# Patient Record
Sex: Male | Born: 1960
Health system: Southern US, Community
[De-identification: ages and names within clinical notes are randomized; demographics above are authoritative.]

## PROBLEM LIST (undated history)

## (undated) DIAGNOSIS — J449 Chronic obstructive pulmonary disease, unspecified: Secondary | ICD-10-CM

## (undated) DIAGNOSIS — I4891 Unspecified atrial fibrillation: Secondary | ICD-10-CM

## (undated) HISTORY — PX: LEG SURGERY: SHX1003

---

## 2016-06-05 ENCOUNTER — Emergency Department (HOSPITAL_BASED_OUTPATIENT_CLINIC_OR_DEPARTMENT_OTHER)
Admission: EM | Admit: 2016-06-05 | Discharge: 2016-06-05 | Disposition: A | Discharge status: Home / Self Care | Payer: Medicaid Other | Attending: Emergency Medicine | Admitting: Emergency Medicine

## 2016-06-05 ENCOUNTER — Emergency Department (HOSPITAL_BASED_OUTPATIENT_CLINIC_OR_DEPARTMENT_OTHER): Payer: Medicaid Other

## 2016-06-05 ENCOUNTER — Encounter (HOSPITAL_BASED_OUTPATIENT_CLINIC_OR_DEPARTMENT_OTHER): Payer: Self-pay | Admitting: *Deleted

## 2016-06-05 DIAGNOSIS — I4891 Unspecified atrial fibrillation: Secondary | ICD-10-CM | POA: Diagnosis not present

## 2016-06-05 DIAGNOSIS — F17210 Nicotine dependence, cigarettes, uncomplicated: Secondary | ICD-10-CM | POA: Diagnosis not present

## 2016-06-05 DIAGNOSIS — R0602 Shortness of breath: Secondary | ICD-10-CM | POA: Diagnosis present

## 2016-06-05 LAB — BASIC METABOLIC PANEL
ANION GAP: 9 (ref 5–15)
BUN: 12 mg/dL (ref 6–20)
CALCIUM: 9 mg/dL (ref 8.9–10.3)
CO2: 25 mmol/L (ref 22–32)
CREATININE: 0.96 mg/dL (ref 0.61–1.24)
Chloride: 102 mmol/L (ref 101–111)
GFR calc Af Amer: >60 mL/min (ref >60)
GLUCOSE: 165 mg/dL — AB (ref 65–99)
Potassium: 4 mmol/L (ref 3.5–5.1)
Sodium: 136 mmol/L (ref 135–145)

## 2016-06-05 LAB — CBC WITH DIFFERENTIAL/PLATELET
Basophils Absolute: 0.1 10*3/uL (ref 0.0–0.1)
Basophils Relative: 1 %
EOS PCT: 3 %
Eosinophils Absolute: 0.3 10*3/uL (ref 0.0–0.7)
HEMATOCRIT: 42.6 % (ref 39.0–52.0)
Hemoglobin: 13.7 g/dL (ref 13.0–17.0)
LYMPHS ABS: 1.9 10*3/uL (ref 0.7–4.0)
LYMPHS PCT: 22 %
MCH: 30.2 pg (ref 26.0–34.0)
MCHC: 32.2 g/dL (ref 30.0–36.0)
MCV: 94 fL (ref 78.0–100.0)
MONO ABS: 0.9 10*3/uL (ref 0.1–1.0)
MONOS PCT: 10 %
NEUTROS ABS: 5.7 10*3/uL (ref 1.7–7.7)
Neutrophils Relative %: 64 %
PLATELETS: 242 10*3/uL (ref 150–400)
RBC: 4.53 MIL/uL (ref 4.22–5.81)
RDW: 14.1 % (ref 11.5–15.5)
WBC: 8.9 10*3/uL (ref 4.0–10.5)

## 2016-06-05 LAB — PROTIME-INR
INR: 1.21
Prothrombin Time: 15.4 seconds — ABNORMAL HIGH (ref 11.4–15.2)

## 2016-06-05 MED ORDER — DILTIAZEM HCL 60 MG PO TABS: 60.0000 mg | ORAL_TABLET | Freq: Four times a day (QID) | ORAL | 0 refills | Status: AC

## 2016-06-05 MED ORDER — FUROSEMIDE 10 MG/ML IJ SOLN
40.0000 mg | Freq: Once | INTRAMUSCULAR | Status: AC
  Administered 2016-06-05: 40 mg via INTRAVENOUS
  Filled 2016-06-05: qty 4

## 2016-06-05 MED ORDER — DILTIAZEM HCL 100 MG IV SOLR
5.0000 mg/h | Freq: Once | INTRAVENOUS | Status: AC
  Administered 2016-06-05: 5 mg/h via INTRAVENOUS

## 2016-06-05 MED ORDER — DILTIAZEM HCL-DEXTROSE 100-5 MG/100ML-% IV SOLN (PREMIX)
INTRAVENOUS | Status: AC
  Administered 2016-06-05: 5 mg via INTRAVENOUS
  Filled 2016-06-05: qty 100

## 2016-06-05 MED ORDER — DILTIAZEM HCL 25 MG/5ML IV SOLN
20.0000 mg | Freq: Once | INTRAVENOUS | Status: AC
  Administered 2016-06-05: 20 mg via INTRAVENOUS
  Filled 2016-06-05: qty 5

## 2016-06-05 MED ORDER — DILTIAZEM HCL ER COATED BEADS 120 MG PO CP24
120.0000 mg | ORAL_CAPSULE | Freq: Once | ORAL | Status: AC
Start: 2016-06-05 — End: 2016-06-05
  Administered 2016-06-05: 120 mg via ORAL
  Filled 2016-06-05: qty 1

## 2016-06-05 MED ORDER — FUROSEMIDE 20 MG PO TABS: 20.0000 mg | ORAL_TABLET | Freq: Every day | ORAL | 0 refills | Status: AC

## 2016-06-05 NOTE — Discharge Instructions (Signed)
Fill your prescriptions tonight to start tomorrow morning.  Take a baby aspirin each morning.  Office appointment tomorrow afternoon at 4:00 at Dr. Roseanne Kaufman office. Please be on time.

## 2016-06-05 NOTE — ED Triage Notes (Signed)
C/o sob x 3 weeks worse with walking. No pain. No other sx.

## 2016-06-05 NOTE — ED Provider Notes (Addendum)
[facility]   CSN: [id number] Arrival date & time: 06/05/16  1551  First Provider Contact:  None    By signing my name below, I, Tanner Hawkins, attest that this documentation has been prepared under the direction and in the presence of Rolland Porter, MD . Electronically Signed: Majel Hawkins, Scribe. 06/05/2016. 4:36 PM.  History   Chief Complaint Chief Complaint  Patient presents with  . Shortness of Breath   HPI Comments: Tanner Hawkins is a 55 y.o. male who presents to the Emergency Department complaining of gradually worsening, intermittent, shortness of breath that began 3 weeks ago. Pt reports his pain is exacerbated when breathing deeply, walking, and lying flat on his back at night. He notes he was told ~1 year ago that he should have his heart checked due to an irregular rhythm; he states he did not receive an EKG. Pt denies dizziness, lightheadedness, chest pain or pressure, leg swelling, and use of any illicit drugs. He states he smokes ~1 pack a day. He denies hx of HTN, HLD and DM.   The history is provided by the patient. No language interpreter was used.   History reviewed. No pertinent past medical history.  There are no active problems to display for this patient.  History reviewed. No pertinent surgical history.   Home Medications    Prior to Admission medications   Medication Sig Start Date End Date Taking? Authorizing Provider  diltiazem (CARDIZEM) 60 MG tablet Take 1 tablet (60 mg total) by mouth 4 (four) times daily. 06/05/16   Rolland Porter, MD  furosemide (LASIX) 20 MG tablet Take 1 tablet (20 mg total) by mouth daily. 06/05/16   Rolland Porter, MD    Family History No family history on file.  Social History Social History  Substance Use Topics  . Smoking status: Current Every Day Smoker    Packs/day: 1.00    Types: Cigarettes  . Smokeless tobacco: Never Used  . Alcohol use Not on file     Allergies   Review of patient's allergies  indicates no known allergies.  Review of Systems Review of Systems  Constitutional: Negative for appetite change, chills, diaphoresis, fatigue and fever.  HENT: Negative for mouth sores, sore throat and trouble swallowing.   Eyes: Negative for visual disturbance.  Respiratory: Positive for shortness of breath. Negative for cough, chest tightness and wheezing.   Cardiovascular: Negative for chest pain and leg swelling.  Gastrointestinal: Negative for abdominal distention, abdominal pain, diarrhea, nausea and vomiting.  Endocrine: Negative for polydipsia, polyphagia and polyuria.  Genitourinary: Negative for dysuria, frequency and hematuria.  Musculoskeletal: Negative for gait problem.  Skin: Negative for color change, pallor and rash.  Neurological: Negative for dizziness, syncope, light-headedness and headaches.  Hematological: Does not bruise/bleed easily.  Psychiatric/Behavioral: Negative for behavioral problems and confusion.   Physical Exam Updated Vital Signs BP (!) 145/102 (BP Location: Left Arm)   Pulse 68   Temp 98 F (36.7 C) (Oral)   Resp 18   Ht 6' (1.829 m)   Wt 214 lb (97.1 kg)   SpO2 99%   BMI 29.02 kg/m   Physical Exam  Constitutional: He is oriented to person, place, and time. He appears well-developed and well-nourished. No distress.  HENT:  Head: Normocephalic.  Eyes: Conjunctivae are normal. Pupils are equal, round, and reactive to light. No scleral icterus.  Neck: Normal range of motion. Neck supple. No thyromegaly present.  Cardiovascular: Normal heart sounds.  Exam reveals no gallop  and no friction rub.   No murmur heard. Tachycardic and irregular   Pulmonary/Chest: Effort normal and breath sounds normal. No respiratory distress. He has no wheezes. He has no rales.  Abdominal: Soft. Bowel sounds are normal. He exhibits no distension. There is no tenderness. There is no rebound.  Musculoskeletal: Normal range of motion.  Neurological: He is alert and  oriented to person, place, and time.  Skin: Skin is warm and dry. No rash noted.  Psychiatric: He has a normal mood and affect. His behavior is normal.   ED Treatments / Results  Labs (all labs ordered are listed, but only abnormal results are displayed) Labs Reviewed  BASIC METABOLIC PANEL - Abnormal; Notable for the following:       Result Value   Glucose, Bld 165 (*)    All other components within normal limits  PROTIME-INR - Abnormal; Notable for the following:    Prothrombin Time 15.4 (*)    All other components within normal limits  CBC WITH DIFFERENTIAL/PLATELET  TROPONIN I   EKG  EKG Interpretation None       Radiology Dg Chest 2 View  Result Date: 06/05/2016 CLINICAL DATA:  Shortness breath for 2-3 weeks. EXAM: CHEST  2 VIEW COMPARISON:  None. FINDINGS: The heart size is borderline. The hila and mediastinum are normal. Mild increased interstitial markings centrally are identified. No pulmonary nodules or masses. No focal infiltrates. IMPRESSION: Mild increased central interstitial markings, right greater than left, suggesting mild asymmetric edema. Atypical infection could have a similar appearance in the appropriate clinical setting. No other acute abnormalities. Electronically Signed   By: Gerome Sam III M.D   On: 06/05/2016 17:26   Procedures Procedures  DIAGNOSTIC STUDIES:  Oxygen Saturation is 99% on RA, normal by my interpretation.    COORDINATION OF CARE:  4:35 PM Discussed treatment plan, which includes EKG with pt at bedside and pt agreed to plan.  Medications Ordered in ED Medications  diltiazem (CARDIZEM) injection 20 mg (20 mg Intravenous Given 06/05/16 1723)  diltiazem (CARDIZEM) 100 mg in dextrose 5 % 100 mL (1 mg/mL) infusion (0 mg/hr Intravenous Stopped 06/05/16 1755)  dilTIAZem HCl-Dextrose 100-5 MG/100ML-% infusion SOLN (5 mg Intravenous Given 06/05/16 1731)  diltiazem (CARDIZEM CD) 24 hr capsule 120 mg (120 mg Oral Given 06/05/16 1833)    furosemide (LASIX) injection 40 mg (40 mg Intravenous Given 06/05/16 1901)    Initial Impression / Assessment and Plan / ED Course  I have reviewed the triage vital signs and the nursing notes.  Pertinent labs & imaging results that were available during my care of the patient were reviewed by me and considered in my medical decision making (see chart for details).  Clinical Course    I discussed the case with on-call cardiology Dr.Kadakia.  He recommended heparin, admission, TEE and early cardioversion. He felt that this sooner if this could happen the bed of the patient's possibility of conversion considering he has been symptomatic for 3 weeks.  I discussed this at length with the patient. He very politely but adamantly states he cannot will not stay in the hospital as he has "things to do". He states he asked speak with his boss and that he absolutely has to be at work in the morning and feels that he does this to his employer.  I did have him sign an AGAINST MEDICAL ADVICE form. I feel that the most prudent course of action for him would be to follow the advice of the  consulting cardiologist. However, he has been stable over the 3 weeks. He does have some mild dyspnea but no overt CHF. Dr. Algie Coffer has recommended Cardizem. Patient's chadsvasc2 score is 0-1 based on presence or absence of congestive heart failure ( suggested on chest x-ray, but not clinically).  I recommended daily aspirin, Cardizem 60 mg every 6 hours, daily Lasix 20 mg daily.  Doctor Algie Coffer felt he can see the patient tomorrow in his office at 4:00 in the afternoon and the patient can simply show up  Final Clinical Impressions(s) / ED Diagnoses   Final diagnoses:  Atrial fibrillation, unspecified type (HCC)    CRITICAL CARE Performed by: Rolland Porter JOSEPH   Total critical care time: 30 minutes including bolus of IV Cardizem and initiation of drip. After rate control drip was able to be almost immediately  discontinued.  Critical care time was exclusive of separately billable procedures and treating other patients.  Critical care was necessary to treat or prevent imminent or life-threatening deterioration.  Critical care was time spent personally by me on the following activities: development of treatment plan with patient and/or surrogate as well as nursing, discussions with consultants, evaluation of patient's response to treatment, examination of patient, obtaining history from patient or surrogate, ordering and performing treatments and interventions, ordering and review of laboratory studies, ordering and review of radiographic studies, pulse oximetry and re-evaluation of patient's condition. care  New Prescriptions New Prescriptions   DILTIAZEM (CARDIZEM) 60 MG TABLET    Take 1 tablet (60 mg total) by mouth 4 (four) times daily.   FUROSEMIDE (LASIX) 20 MG TABLET    Take 1 tablet (20 mg total) by mouth daily.   I personally performed the services described in this documentation, which was scribed in my presence. The recorded information has been reviewed and is accurate.     Rolland Porter, MD 06/05/16 2002  I personally performed the services described in this documentation, which was scribed in my presence. The recorded information has been reviewed and is accurate.    Rolland Porter, MD 06/05/16 2003

## 2016-06-05 NOTE — ED Notes (Signed)
Pt transported to xray 

## 2016-06-05 NOTE — ED Notes (Signed)
Due to heart rate MD informed to hold drip at this time right after starting.

## 2018-06-29 ENCOUNTER — Emergency Department (HOSPITAL_BASED_OUTPATIENT_CLINIC_OR_DEPARTMENT_OTHER)
Admission: EM | Admit: 2018-06-29 | Discharge: 2018-06-29 | Disposition: A | Discharge status: Home / Self Care | Payer: Medicaid Other | Attending: Emergency Medicine | Admitting: Emergency Medicine

## 2018-06-29 ENCOUNTER — Encounter (HOSPITAL_BASED_OUTPATIENT_CLINIC_OR_DEPARTMENT_OTHER): Payer: Self-pay

## 2018-06-29 ENCOUNTER — Other Ambulatory Visit: Payer: Self-pay

## 2018-06-29 DIAGNOSIS — F17210 Nicotine dependence, cigarettes, uncomplicated: Secondary | ICD-10-CM | POA: Insufficient documentation

## 2018-06-29 DIAGNOSIS — I4891 Unspecified atrial fibrillation: Secondary | ICD-10-CM | POA: Diagnosis not present

## 2018-06-29 DIAGNOSIS — Z79899 Other long term (current) drug therapy: Secondary | ICD-10-CM | POA: Diagnosis not present

## 2018-06-29 DIAGNOSIS — L03116 Cellulitis of left lower limb: Secondary | ICD-10-CM | POA: Diagnosis not present

## 2018-06-29 DIAGNOSIS — Z7901 Long term (current) use of anticoagulants: Secondary | ICD-10-CM | POA: Diagnosis not present

## 2018-06-29 HISTORY — DX: Unspecified atrial fibrillation: I48.91

## 2018-06-29 MED ORDER — DOXYCYCLINE HYCLATE 100 MG PO TABS
100.0000 mg | ORAL_TABLET | Freq: Once | ORAL | Status: AC
  Administered 2018-06-29: 100 mg via ORAL
  Filled 2018-06-29: qty 1

## 2018-06-29 MED ORDER — CEPHALEXIN 250 MG PO CAPS
500.0000 mg | ORAL_CAPSULE | Freq: Once | ORAL | Status: AC
  Administered 2018-06-29: 500 mg via ORAL
  Filled 2018-06-29: qty 2

## 2018-06-29 MED ORDER — DOXYCYCLINE HYCLATE 100 MG PO CAPS: 100.0000 mg | ORAL_CAPSULE | Freq: Two times a day (BID) | ORAL | 0 refills | Status: DC

## 2018-06-29 MED ORDER — CEPHALEXIN 500 MG PO CAPS: 500.0000 mg | ORAL_CAPSULE | Freq: Three times a day (TID) | ORAL | 0 refills | Status: AC

## 2018-06-29 NOTE — ED Notes (Signed)
[facility] directed on [facility]/c instructions

## 2018-06-29 NOTE — Discharge Instructions (Addendum)
Please take both antibiotics until they are completely gone.  Soak the left leg in warm water daily.  Return to the ER immediately if you have any new or concerning symptoms like fever, worsening redness and swelling, pain with moving your left knee.

## 2018-06-29 NOTE — ED Provider Notes (Signed)
[facility] EMERGENCY DEPARTMENT Provider Note   CSN: [id number] Arrival date & time: 06/29/18  2056     History   Chief Complaint No chief complaint on file.   HPI Tanner Hawkins is a 57 y.o. male.  HPI   Tanner Hawkins is a 57 year old male with a history of atrial fibrillation who presents to the emergency department for evaluation of left knee erythema and swelling.  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  He thinks he may have hit his left knee on a bolt a couple days ago.  States that he noticed an area under the left kneecap which is red and has become more swollen over the past few days.  He was able to express some yellow purulence from the site yesterday.  He denies fevers, chills, numbness, weakness, erythema or wounds elsewhere.  Denies pain with knee ROM.  He reports pain is mild at this time, worsened with palpation.  Has not taken any over-the-counter medications for symptoms.  Past Medical History:  Diagnosis Date  . Atrial fibrillation (HCC)     There are no active problems to display for this patient.   History reviewed. No pertinent surgical history.      Home Medications    Prior to Admission medications   Medication Sig Start Date End Date Taking? Authorizing Provider  digoxin (LANOXIN) 0.25 MG tablet Take 0.25 mg by mouth daily.   Yes [provider]  diltiazem (CARDIZEM) 60 MG tablet Take 1 tablet (60 mg total) by mouth 4 (four) times daily. 06/05/16  Yes Rolland Porter, MD  potassium chloride (K-DUR,KLOR-CON) 10 MEQ tablet Take 10 mEq by mouth 2 (two) times daily.   Yes [provider]  rivaroxaban (XARELTO) 10 MG TABS tablet Take 10 mg by mouth daily.   Yes [provider]  furosemide (LASIX) 20 MG tablet Take 1 tablet (20 mg total) by mouth daily. 06/05/16   Rolland Porter, MD    Family History No family history on file.  Social History Social History    Tobacco Use  . Smoking status: Current Every Day Smoker    Packs/day: 1.00    Types: Cigarettes  . Smokeless tobacco: Never Used  Substance Use Topics  . Alcohol use: Never    Frequency: Never  . Drug use: Never     Allergies   Patient has no known allergies.   Review of Systems Review of Systems  Constitutional: Negative for chills and fever.  Musculoskeletal: Positive for arthralgias (left lower leg).  Skin: Positive for color change (left lower leg).  Neurological: Negative for weakness and numbness.  Psychiatric/Behavioral: Negative for agitation.     Physical Exam Updated Vital Signs BP (!) 167/92 (BP Location: Left Arm)   Pulse 98   Temp 98.8 F (37.1 C) (Oral)   Resp (!) 28   Ht 6' (1.829 m)   Wt 96.6 kg   SpO2 100%   BMI 28.89 kg/m   Physical Exam  Constitutional: He appears well-developed and well-nourished. No distress.  Afebrile and nontoxic-appearing.  HENT:  Head: Normocephalic and atraumatic.  Eyes: Right eye exhibits no discharge. Left eye exhibits no discharge.  Pulmonary/Chest: Effort normal. No respiratory distress.  Musculoskeletal:  Left tibial tuberosity with a 5cm area of erythema, swelling and overlying tenderness. No palpable area of fluctuance.  Erythema does not extend to the knee joint itself.  Full active left knee ROM without pain.  DP pulses 2+ and symmetric bilaterally.  Sensation to light touch intact in bilateral lower extremities.  Neurological: He is alert. Coordination normal.  Skin: He is not diaphoretic.  Psychiatric: He has a normal mood and affect. His behavior is normal.  Nursing note and vitals reviewed.      ED Treatments / Results  Labs (all labs ordered are listed, but only abnormal results are displayed) Labs Reviewed - No data to display  EKG None  Radiology No results found.  Procedures Procedures (including critical care time) EMERGENCY DEPARTMENT US SOFT TISSUE INTERPRETATION "Study: Limited  Soft Tissue Ultrasound"  INDICATIONS: Soft tissue infection Multiple views of the body part were obtained in real-time with a multi-frequency linear probe  PERFORMED BY: Myself IMAGES ARCHIVED?: Yes SIDE:Left BODY PART:Lower extremity INTERPRETATION:  No abcess noted and Cellulitis present  Medications Ordered in ED Medications  doxycycline (VIBRA-TABS) tablet 100 mg (has no administration in time range)  cephALEXin (KEFLEX) capsule 500 mg (has no administration in time range)     Initial Impression / Assessment and Plan / ED Course  I have reviewed the triage vital signs and the nursing notes.  Pertinent labs & imaging results that were available during my care of the patient were reviewed by me and considered in my medical decision making (see chart for details).    Exam consistent with developing cellulitis.  No erythema over the knee joint itself or painful knee ROM.  I have no concern for septic arthritis given exam findings.  Bedside ultrasound with cellulitis, no abscess.  Plan to discharge with dual antibiotic therapy.  Have counseled him on home warm soaks.  Later today, counseled him to have this rechecked by his PCP.  We discussed strict return precautions and he agrees and appears reliable.  Final Clinical Impressions(s) / ED Diagnoses   Final diagnoses:  Cellulitis of left lower extremity    ED Discharge Orders         Ordered    doxycycline (VIBRAMYCIN) 100 MG capsule  2 times daily     06/29/18 2329    cephALEXin (KEFLEX) 500 MG capsule  3 times daily     06/29/18 2329           Kellie ShropshireShrosbree, Wynne Rozak J, PA-C 06/29/18 2330    Gwyneth SproutPlunkett, Whitney, MD 07/01/18 [phone]

## 2018-06-29 NOTE — ED Triage Notes (Signed)
Pt presents with redness to left knee- no obvious swelling. Ambulated without difficulty to triage room. Pt works on [employer] bareing weight on his knees often.

## 2018-09-15 ENCOUNTER — Emergency Department (HOSPITAL_BASED_OUTPATIENT_CLINIC_OR_DEPARTMENT_OTHER)
Admission: EM | Admit: 2018-09-15 | Discharge: 2018-09-15 | Disposition: A | Discharge status: Home / Self Care | Payer: Medicaid Other | Attending: Emergency Medicine | Admitting: Emergency Medicine

## 2018-09-15 ENCOUNTER — Other Ambulatory Visit: Payer: Self-pay

## 2018-09-15 ENCOUNTER — Encounter (HOSPITAL_BASED_OUTPATIENT_CLINIC_OR_DEPARTMENT_OTHER): Payer: Self-pay | Admitting: Emergency Medicine

## 2018-09-15 DIAGNOSIS — Z7901 Long term (current) use of anticoagulants: Secondary | ICD-10-CM | POA: Insufficient documentation

## 2018-09-15 DIAGNOSIS — F17210 Nicotine dependence, cigarettes, uncomplicated: Secondary | ICD-10-CM | POA: Diagnosis not present

## 2018-09-15 DIAGNOSIS — L03115 Cellulitis of right lower limb: Secondary | ICD-10-CM | POA: Diagnosis not present

## 2018-09-15 DIAGNOSIS — M79661 Pain in right lower leg: Secondary | ICD-10-CM | POA: Diagnosis present

## 2018-09-15 DIAGNOSIS — Z79899 Other long term (current) drug therapy: Secondary | ICD-10-CM | POA: Insufficient documentation

## 2018-09-15 MED ORDER — DOXYCYCLINE HYCLATE 100 MG PO CAPS: 100.0000 mg | ORAL_CAPSULE | Freq: Two times a day (BID) | ORAL | 0 refills | Status: DC

## 2018-09-15 NOTE — ED Triage Notes (Signed)
Reports "infection on my right leg".  Report this is red without drainage x 2-3 days.

## 2018-09-15 NOTE — ED Provider Notes (Signed)
[facility] EMERGENCY DEPARTMENT Provider Note   CSN: [id number] Arrival date & time: 09/15/18  1434     History   Chief Complaint Chief Complaint  Patient presents with  . Leg Pain    HPI Tanner Hawkins is a 57 y.o. male.  The history is provided by the patient.  Leg Pain   The pain is present in the right knee. The pain is at a severity of 0/10. The patient is experiencing no pain. Associated symptoms comments: 2 days ago noted some swelling and pustules to the right upper knee.  Squeezed a few and had a small amount of pus and blood.  Now the redness has worsened over the last 2 days.  No pain.  Able to bend the knee without difficulty.  No fever and o/w feeling well.  Similar sx of same.  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 The treatment provided no relief. There has been a history of trauma (scraped up from work).    Past Medical History:  Diagnosis Date  . Atrial fibrillation (HCC)     There are no active problems to display for this patient.   History reviewed. No pertinent surgical history.      Home Medications    Prior to Admission medications   Medication Sig Start Date End Date Taking? Authorizing Provider  digoxin (LANOXIN) 0.25 MG tablet Take 0.25 mg by mouth daily.    [provider]  diltiazem (CARDIZEM) 60 MG tablet Take 1 tablet (60 mg total) by mouth 4 (four) times daily. 06/05/16   Rolland Porter, MD  doxycycline (VIBRAMYCIN) 100 MG capsule Take 1 capsule (100 mg total) by mouth 2 (two) times daily. 09/15/18   Gwyneth Sprout, MD  furosemide (LASIX) 20 MG tablet Take 1 tablet (20 mg total) by mouth daily. 06/05/16   Rolland Porter, MD  potassium chloride (K-DUR,KLOR-CON) 10 MEQ tablet Take 10 mEq by mouth 2 (two) times daily.    [provider]  rivaroxaban (XARELTO) 10 MG TABS tablet Take 10 mg by mouth daily.     [provider]    Family History History reviewed. No pertinent family history.  Social History Social History   Tobacco Use  . Smoking status: Current Every Day Smoker    Packs/day: 1.00    Types: Cigarettes  . Smokeless tobacco: Never Used  Substance Use Topics  . Alcohol use: Never    Frequency: Never  . Drug use: Never     Allergies   Patient has no known allergies.   Review of Systems Review of Systems  All other systems reviewed and are negative.    Physical Exam Updated Vital Signs BP (!) 142/97 (BP Location: Left Arm)   Pulse 90   Temp 98 F (36.7 C) (Oral)   Resp 18   Ht 5\' 11"  (1.803 m)   Wt 96.6 kg   SpO2 100%   BMI 29.71 kg/m   Physical Exam  Constitutional: He is oriented to person, place, and time. He appears well-developed and well-nourished. No distress.  HENT:  Head: Normocephalic and atraumatic.  Mouth/Throat: Oropharynx is clear and moist.  Eyes: Pupils are equal, round, and reactive to light. Conjunctivae and EOM are normal.  Neck: Normal range of motion. Neck supple.  Cardiovascular: Normal rate, regular rhythm and intact distal pulses.  No murmur heard. Pulmonary/Chest: Effort normal and breath  sounds normal. No respiratory distress. He has no wheezes. He has no rales.  Abdominal: Soft. He exhibits no distension. There is no tenderness. There is no rebound and no guarding.  Musculoskeletal: He exhibits no edema or tenderness.       Right knee: He exhibits swelling and erythema. He exhibits normal range of motion and no bony tenderness. No tenderness found.       Legs: Neurological: He is alert and oriented to person, place, and time.  Skin: Skin is warm and dry. No rash noted. No erythema.  Psychiatric: He has a normal mood and affect. His behavior is normal.  Nursing note and vitals reviewed.    ED Treatments / Results  Labs (all labs ordered are listed, but only abnormal results are displayed) Labs Reviewed - No  data to display  EKG None  Radiology No results found.  Procedures Procedures (including critical care time)  Medications Ordered in ED Medications - No data to display   Initial Impression / Assessment and Plan / ED Course  I have reviewed the triage vital signs and the nursing notes.  Pertinent labs & imaging results that were available during my care of the patient were reviewed by me and considered in my medical decision making (see chart for details).     57 year old male presenting today with evidence of cellulitis to the right knee after draining some pustules after scraping his leg.  He has full range of motion his knee and no evidence of septic joint.  This all seems superficial and cellulitis from recent small abscesses.  Patient has no systemic symptoms.  Vital signs are reassuring.  Will cover with doxycycline and have him follow-up with his PCP if not improving.  Final Clinical Impressions(s) / ED Diagnoses   Final diagnoses:  Cellulitis of right lower extremity    ED Discharge Orders         Ordered    doxycycline (VIBRAMYCIN) 100 MG capsule  2 times daily     09/15/18 1524           Gwyneth Sprout, MD 09/15/18 1529

## 2018-10-17 ENCOUNTER — Other Ambulatory Visit: Payer: Self-pay

## 2018-10-17 ENCOUNTER — Emergency Department (HOSPITAL_BASED_OUTPATIENT_CLINIC_OR_DEPARTMENT_OTHER)
Admission: EM | Admit: 2018-10-17 | Discharge: 2018-10-17 | Disposition: A | Discharge status: Home / Self Care | Payer: Medicaid Other | Attending: Emergency Medicine | Admitting: Emergency Medicine

## 2018-10-17 ENCOUNTER — Encounter (HOSPITAL_BASED_OUTPATIENT_CLINIC_OR_DEPARTMENT_OTHER): Payer: Self-pay | Admitting: *Deleted

## 2018-10-17 DIAGNOSIS — B9789 Other viral agents as the cause of diseases classified elsewhere: Secondary | ICD-10-CM | POA: Diagnosis not present

## 2018-10-17 DIAGNOSIS — Z79899 Other long term (current) drug therapy: Secondary | ICD-10-CM | POA: Insufficient documentation

## 2018-10-17 DIAGNOSIS — R05 Cough: Secondary | ICD-10-CM | POA: Diagnosis present

## 2018-10-17 DIAGNOSIS — F17210 Nicotine dependence, cigarettes, uncomplicated: Secondary | ICD-10-CM | POA: Insufficient documentation

## 2018-10-17 DIAGNOSIS — J449 Chronic obstructive pulmonary disease, unspecified: Secondary | ICD-10-CM | POA: Diagnosis not present

## 2018-10-17 DIAGNOSIS — J069 Acute upper respiratory infection, unspecified: Secondary | ICD-10-CM | POA: Insufficient documentation

## 2018-10-17 DIAGNOSIS — Z7901 Long term (current) use of anticoagulants: Secondary | ICD-10-CM | POA: Insufficient documentation

## 2018-10-17 NOTE — ED Provider Notes (Signed)
[facility] EMERGENCY DEPARTMENT Provider Note   CSN: [id number] Arrival date & time: 10/17/18  1842     History   Chief Complaint Chief Complaint  Patient presents with  . Cough    HPI Tanner Hawkins is a 57 y.o. male with PMH of afib on xarelto and COPD. presenting with 2 days of runny nose, congestion, and cough. His roommate has been sick for the past 2 weeks. Mr. Reckner is worried he'll get more sick and wanted to come in to get checked out. He reports his cough is productive with clear mucous. He is not producing more mucous than normal. He has no shortness of breath. He denies wheezing. He is a smoker and uses spiriva daily and albuterol 2 puffs once a day. He has not needed to use his inhaler more often. He is eating and drinking normally with normal urine and stools.   HPI  Past Medical History:  Diagnosis Date  . Atrial fibrillation (HCC)     There are no active problems to display for this patient.   History reviewed. No pertinent surgical history.     Home Medications    Prior to Admission medications   Medication Sig Start Date End Date Taking? Authorizing Provider  digoxin (LANOXIN) 0.25 MG tablet Take 0.25 mg by mouth daily.    [provider]  diltiazem (CARDIZEM) 60 MG tablet Take 1 tablet (60 mg total) by mouth 4 (four) times daily. 06/05/16   Rolland Porter, MD  doxycycline (VIBRAMYCIN) 100 MG capsule Take 1 capsule (100 mg total) by mouth 2 (two) times daily. 09/15/18   Gwyneth Sprout, MD  furosemide (LASIX) 20 MG tablet Take 1 tablet (20 mg total) by mouth daily. 06/05/16   Rolland Porter, MD  potassium chloride (K-DUR,KLOR-CON) 10 MEQ tablet Take 10 mEq by mouth 2 (two) times daily.    [provider]  rivaroxaban (XARELTO) 10 MG TABS tablet Take 10 mg by mouth daily.    [provider]    Family History No family history on file.  Social History Social History   Tobacco Use  . Smoking status: Current Every Day  Smoker    Packs/day: 1.00    Types: Cigarettes  . Smokeless tobacco: Never Used  Substance Use Topics  . Alcohol use: Never    Frequency: Never  . Drug use: Never     Allergies   Patient has no known allergies.   Review of Systems Review of Systems  Constitutional: Negative for activity change, appetite change, chills and fever.  HENT: Positive for congestion and rhinorrhea. Negative for ear pain, facial swelling and sore throat.   Eyes: Negative for redness.  Respiratory: Positive for cough. Negative for shortness of breath and wheezing.   Cardiovascular: Negative for chest pain and palpitations.  Gastrointestinal: Negative for abdominal pain, constipation, diarrhea, nausea and vomiting.  Genitourinary: Negative for decreased urine volume and difficulty urinating.  Musculoskeletal: Negative for myalgias.  Skin: Negative for rash.  Neurological: Negative for weakness and headaches.     Physical Exam Updated Vital Signs BP (!) 135/93 (BP Location: Left Arm)   Pulse 94   Temp 98.2 F (36.8 C) (Oral)   Resp 20   Ht 5\' 11"  (1.803 m)   Wt 102.1 kg   SpO2 100%   BMI 31.38 kg/m   Physical Exam Vitals signs and nursing note reviewed.  Constitutional:      Appearance: Normal appearance.  HENT:     Head: Normocephalic and  atraumatic.     Nose: Congestion present.     Mouth/Throat:     Mouth: Mucous membranes are moist.     Pharynx: No oropharyngeal exudate or posterior oropharyngeal erythema.  Eyes:     Extraocular Movements: Extraocular movements intact.     Pupils: Pupils are equal, round, and reactive to light.  Neck:     Musculoskeletal: Normal range of motion and neck supple.  Cardiovascular:     Rate and Rhythm: Normal rate and regular rhythm.     Pulses: Normal pulses.     Heart sounds: Normal heart sounds.  Pulmonary:     Effort: Pulmonary effort is normal.     Breath sounds: Wheezing present.  Abdominal:     General: Abdomen is flat.     Palpations:  Abdomen is soft.     Tenderness: There is no abdominal tenderness.  Musculoskeletal: Normal range of motion.  Skin:    General: Skin is warm and dry.     Capillary Refill: Capillary refill takes less than 2 seconds.  Neurological:     General: No focal deficit present.     Mental Status: He is alert and oriented to person, place, and time.  Psychiatric:        Mood and Affect: Mood normal.        Behavior: Behavior normal.      ED Treatments / Results  Labs (all labs ordered are listed, but only abnormal results are displayed) Labs Reviewed - No data to display  EKG None  Radiology No results found.  Procedures Procedures (including critical care time)  Medications Ordered in ED Medications - No data to display   Initial Impression / Assessment and Plan / ED Course  I have reviewed the triage vital signs and the nursing notes.  Pertinent labs & imaging results that were available during my care of the patient were reviewed by me and considered in my medical decision making (see chart for details).     57 year old male with COPD and afib presenting with 2 days of viral URI symptoms. No increased sputum, fevers, or SOB to indicate COPD exacerbation. He is well appearing and afebrile. Minimal wheezing on exam. Reassurance provided. Advised him to continue spiriva and schedule albuterol every 6 hours for next 2-3 days given viral illness. Discussed reasons to return including fevers, increased sputum, SOB. Patient verbalized understanding and agreement with plan.   Final Clinical Impressions(s) / ED Diagnoses   Final diagnoses:  Viral URI with cough    ED Discharge Orders    None       Tillman SersRiccio, Wilberta Dorvil C, DO 10/17/18 2115    Little, Ambrose Finlandachel Morgan, MD 10/20/18 2037

## 2018-10-17 NOTE — Discharge Instructions (Signed)
°  Nice to see you today! You have a virus causing your symptoms.  Please use your albuterol inhaler every 6 hours for the next 2-3 days. You can take over the counter cough medicine like mucinex-D for your cough and congestion. If you have any difficulty breathing, are coughing up colored sputum, or have fevers, please return to be seen.

## 2018-10-17 NOTE — ED Notes (Signed)
ED Provider at bedside. 

## 2018-10-17 NOTE — ED Triage Notes (Signed)
Cough and runny nose x 3 days.

## 2018-10-25 ENCOUNTER — Emergency Department (HOSPITAL_BASED_OUTPATIENT_CLINIC_OR_DEPARTMENT_OTHER): Payer: Medicaid Other

## 2018-10-25 ENCOUNTER — Emergency Department (HOSPITAL_BASED_OUTPATIENT_CLINIC_OR_DEPARTMENT_OTHER)
Admission: EM | Admit: 2018-10-25 | Discharge: 2018-10-25 | Disposition: A | Discharge status: Home / Self Care | Payer: Medicaid Other | Attending: Emergency Medicine | Admitting: Emergency Medicine

## 2018-10-25 ENCOUNTER — Other Ambulatory Visit: Payer: Self-pay

## 2018-10-25 ENCOUNTER — Encounter (HOSPITAL_BASED_OUTPATIENT_CLINIC_OR_DEPARTMENT_OTHER): Payer: Self-pay | Admitting: Emergency Medicine

## 2018-10-25 DIAGNOSIS — Z7901 Long term (current) use of anticoagulants: Secondary | ICD-10-CM | POA: Diagnosis not present

## 2018-10-25 DIAGNOSIS — J441 Chronic obstructive pulmonary disease with (acute) exacerbation: Secondary | ICD-10-CM | POA: Insufficient documentation

## 2018-10-25 DIAGNOSIS — Z79899 Other long term (current) drug therapy: Secondary | ICD-10-CM | POA: Insufficient documentation

## 2018-10-25 DIAGNOSIS — R0602 Shortness of breath: Secondary | ICD-10-CM | POA: Diagnosis present

## 2018-10-25 DIAGNOSIS — F17210 Nicotine dependence, cigarettes, uncomplicated: Secondary | ICD-10-CM | POA: Insufficient documentation

## 2018-10-25 MED ORDER — ALBUTEROL SULFATE (2.5 MG/3ML) 0.083% IN NEBU: 2.5000 mg | INHALATION_SOLUTION | Freq: Four times a day (QID) | RESPIRATORY_TRACT | 12 refills | Status: DC | PRN

## 2018-10-25 MED ORDER — PREDNISONE 50 MG PO TABS
60.0000 mg | ORAL_TABLET | Freq: Once | ORAL | Status: AC
  Administered 2018-10-25: 60 mg via ORAL
  Filled 2018-10-25: qty 1

## 2018-10-25 MED ORDER — ALBUTEROL SULFATE (2.5 MG/3ML) 0.083% IN NEBU
5.0000 mg | INHALATION_SOLUTION | Freq: Once | RESPIRATORY_TRACT | Status: AC
  Administered 2018-10-25: 5 mg via RESPIRATORY_TRACT
  Filled 2018-10-25: qty 6

## 2018-10-25 NOTE — Progress Notes (Signed)
Patient ambulated around the department while on pulse ox.  Patient's SPO2 remained between 95% and 96% while ambulating and HR was 112 upon return to his room.

## 2018-10-25 NOTE — ED Notes (Signed)
Pt. Tanner Hawkins he was still having shortness of breath since [facility].  Pt. Felt he was no better and never had a breathing treatment like they said he would get.   Pt.  Has seen Resp. Therapy here tonight and received treatment with reports of breathing better now.

## 2018-10-25 NOTE — ED Triage Notes (Signed)
Reports shortness of breath with productive cough.

## 2018-10-25 NOTE — ED Provider Notes (Signed)
[facility] EMERGENCY DEPARTMENT Provider Note   CSN: [id number] Arrival date & time: 10/25/18  1800     History   Chief Complaint Chief Complaint  Patient presents with  . Shortness of Breath    HPI Tanner Hawkins is a 57 y.o. male.  Patient is a 57 year old male with a history of COPD who presents with shortness of breath.  He states for the bout the last week and a half he has had some runny nose nasal congestion and coughing.  He has been seen here as well as [facility] in the emergency department over the last 2 weeks.  He states he has been using his albuterol inhaler frequently during the day.  He denies any chest pain.  His cough is mostly nonproductive but he also has some nasal congestion which he says is yellow and green.  No known fevers.  He has not reported any known wheezing.  He gets short of breath mostly when he is at work.  He says he is unable to follow-up with his PCP because Dr. Algie Coffer is in [place] until January.     Past Medical History:  Diagnosis Date  . Atrial fibrillation (HCC)     There are no active problems to display for this patient.   History reviewed. No pertinent surgical history.      Home Medications    Prior to Admission medications   Medication Sig Start Date End Date Taking? Authorizing Provider  albuterol (PROVENTIL) (2.5 MG/3ML) 0.083% nebulizer solution Take 3 mLs (2.5 mg total) by nebulization every 6 (six) hours as needed for wheezing or shortness of breath. 10/25/18   Rolan Bucco, MD  digoxin (LANOXIN) 0.25 MG tablet Take 0.25 mg by mouth daily.    [provider]  diltiazem (CARDIZEM) 60 MG tablet Take 1 tablet (60 mg total) by mouth 4 (four) times daily. 06/05/16   Rolland Porter, MD  doxycycline (VIBRAMYCIN) 100 MG capsule Take 1 capsule (100 mg total) by mouth 2 (two) times daily. 09/15/18   Gwyneth Sprout, MD  furosemide (LASIX) 20 MG tablet Take 1 tablet (20 mg total) by  mouth daily. 06/05/16   Rolland Porter, MD  potassium chloride (K-DUR,KLOR-CON) 10 MEQ tablet Take 10 mEq by mouth 2 (two) times daily.    [provider]  rivaroxaban (XARELTO) 10 MG TABS tablet Take 10 mg by mouth daily.    [provider]    Family History History reviewed. No pertinent family history.  Social History Social History   Tobacco Use  . Smoking status: Current Every Day Smoker    Packs/day: 1.00    Types: Cigarettes  . Smokeless tobacco: Never Used  Substance Use Topics  . Alcohol use: Never    Frequency: Never  . Drug use: Never     Allergies   Patient has no known allergies.   Review of Systems Review of Systems  Constitutional: Negative for chills, diaphoresis, fatigue and fever.  HENT: Positive for congestion and postnasal drip. Negative for rhinorrhea and sneezing.   Eyes: Negative.   Respiratory: Positive for cough and shortness of breath. Negative for chest tightness.   Cardiovascular: Negative for chest pain and leg swelling.  Gastrointestinal: Negative for abdominal pain, blood in stool, diarrhea, nausea and vomiting.  Genitourinary: Negative for difficulty urinating, flank pain, frequency and hematuria.  Musculoskeletal: Negative for arthralgias and back pain.  Skin: Negative for rash.  Neurological: Negative for dizziness, speech difficulty, weakness, numbness and headaches.  Physical Exam Updated Vital Signs BP (!) 147/99   Pulse 88   SpO2 94%   Physical Exam Constitutional:      Appearance: He is well-developed.  HENT:     Head: Normocephalic and atraumatic.  Eyes:     Pupils: Pupils are equal, round, and reactive to light.  Neck:     Musculoskeletal: Normal range of motion and neck supple.  Cardiovascular:     Rate and Rhythm: Normal rate and regular rhythm.     Heart sounds: Normal heart sounds.  Pulmonary:     Effort: Pulmonary effort is normal. No respiratory distress.     Breath sounds: Decreased breath  sounds and wheezing present. No rales.  Chest:     Chest wall: No tenderness.  Abdominal:     General: Bowel sounds are normal.     Palpations: Abdomen is soft.     Tenderness: There is no abdominal tenderness. There is no guarding or rebound.  Musculoskeletal: Normal range of motion.     Right lower leg: He exhibits no tenderness. No edema.     Left lower leg: He exhibits no tenderness. No edema.  Lymphadenopathy:     Cervical: No cervical adenopathy.  Skin:    General: Skin is warm and dry.     Findings: No rash.  Neurological:     Mental Status: He is alert and oriented to person, place, and time.      ED Treatments / Results  Labs (all labs ordered are listed, but only abnormal results are displayed) Labs Reviewed - No data to display  EKG EKG Interpretation  Date/Time:  Friday October 25 2018 18:11:27 EST Ventricular Rate:  92 PR Interval:    QRS Duration: 92 QT Interval:  344 QTC Calculation: 425 R Axis:   84 Text Interpretation:  Atrial fibrillation Abnormal ECG since last tracing no significant change Confirmed by Rolan BuccoBelfi, Denver Harder [phone]([id number]) on 10/25/2018 6:13:41 PM   Radiology Dg Chest 2 View  Result Date: 10/25/2018 CLINICAL DATA:  Cough and shortness of breath for 1 and half weeks. EXAM: CHEST - 2 VIEW COMPARISON:  Two-view chest x-ray 10/23/2018 FINDINGS: The heart size and mediastinal contours are within normal limits. Both lungs are clear. The visualized skeletal structures are unremarkable. IMPRESSION: Negative two view chest x-ray Electronically Signed   By: Marin Robertshristopher  Mattern M.D.   On: 10/25/2018 18:47    Procedures Procedures (including critical care time)  Medications Ordered in ED Medications  albuterol (PROVENTIL) (2.5 MG/3ML) 0.083% nebulizer solution 5 mg (5 mg Nebulization Given 10/25/18 1947)  predniSONE (DELTASONE) tablet 60 mg (60 mg Oral Given 10/25/18 1945)     Initial Impression / Assessment and Plan / ED Course  I have reviewed  the triage vital signs and the nursing notes.  Pertinent labs & imaging results that were available during my care of the patient were reviewed by me and considered in my medical decision making (see chart for details).     Patient presents with wheezing and shortness of breath.  He has diminished breath sounds.  He was given nebulizer treatment and feels much better.  He is able to ambulate without hypoxia or shortness of breath.  He is currently on a prednisone burst that was prescribed to him from [facility].  His EKG does not show any ischemic changes.  Chest x-ray does not show any evidence of pneumonia.  He does not want any labs done because he had labs done at [facility] 2  days ago.  I reviewed these labs and he had negative troponin, his other labs were non-concerning.  His white count was minimally elevated.  Patient currently is back to baseline.  He has no hypoxia or shortness of breath or persistent tachycardia which we more concerning for PE.  He does not have symptoms that would be more concerning for ACS.  He is on Symbicort and he is currently on prednisone.  He does have a nebulizer machine at home but does not have the solution.  I will give him prescription for the nebulizer solution which may work better than his albuterol inhaler.  He will follow-up with Dr. Algie CofferKadakia soon as he gets back.  He was advised to return to the emergency room if he has any worsening symptoms.  Final Clinical Impressions(s) / ED Diagnoses   Final diagnoses:  COPD exacerbation [facility](HCC)    ED Discharge Orders         Ordered    albuterol (PROVENTIL) (2.5 MG/3ML) 0.083% nebulizer solution  Every 6 hours PRN     10/25/18 2143           Rolan BuccoBelfi, Kelisha Dall, MD 10/25/18 2147

## 2018-10-25 NOTE — ED Notes (Signed)
PT reports that [facility] and that they were all normal. HE would prefer to not have labs drawn unless necessary.

## 2019-01-13 ENCOUNTER — Encounter (HOSPITAL_BASED_OUTPATIENT_CLINIC_OR_DEPARTMENT_OTHER): Payer: Self-pay | Admitting: *Deleted

## 2019-01-13 ENCOUNTER — Emergency Department (HOSPITAL_BASED_OUTPATIENT_CLINIC_OR_DEPARTMENT_OTHER)
Admission: EM | Admit: 2019-01-13 | Discharge: 2019-01-14 | Disposition: A | Discharge status: Home / Self Care | Payer: Medicaid Other | Attending: Emergency Medicine | Admitting: Emergency Medicine

## 2019-01-13 ENCOUNTER — Other Ambulatory Visit: Payer: Self-pay

## 2019-01-13 DIAGNOSIS — Z79899 Other long term (current) drug therapy: Secondary | ICD-10-CM | POA: Insufficient documentation

## 2019-01-13 DIAGNOSIS — L02416 Cutaneous abscess of left lower limb: Secondary | ICD-10-CM | POA: Insufficient documentation

## 2019-01-13 DIAGNOSIS — R2242 Localized swelling, mass and lump, left lower limb: Secondary | ICD-10-CM | POA: Diagnosis present

## 2019-01-13 DIAGNOSIS — F17210 Nicotine dependence, cigarettes, uncomplicated: Secondary | ICD-10-CM | POA: Insufficient documentation

## 2019-01-13 DIAGNOSIS — L03116 Cellulitis of left lower limb: Secondary | ICD-10-CM

## 2019-01-13 NOTE — ED Provider Notes (Signed)
[facility] Provider Note: Lowella Dell, MD, FACEP  CSN: [id number] MRN: [id number] ARRIVAL: 01/13/19 at 2013 ROOM: MHFT2/MHFT2   CHIEF COMPLAINT  Abscess   HISTORY OF PRESENT ILLNESS  01/13/19 11:51 PM Tanner Hawkins is a 58 y.o. male who is had a tender, swollen, erythematous area on his left thigh for the past several days.  There is a central pustule.  Pain is moderate, worse with palpation.  He denies other complaint.   Past Medical History:  Diagnosis Date  . Atrial fibrillation (HCC)     History reviewed. No pertinent surgical history.  No family history on file.  Social History   Tobacco Use  . Smoking status: Current Every Day Smoker    Packs/day: 1.00    Types: Cigarettes  . Smokeless tobacco: Never Used  Substance Use Topics  . Alcohol use: Never    Frequency: Never  . Drug use: Never    Prior to Admission medications   Medication Sig Start Date End Date Taking? Authorizing Provider  albuterol (PROVENTIL) (2.5 MG/3ML) 0.083% nebulizer solution Take 3 mLs (2.5 mg total) by nebulization every 6 (six) hours as needed for wheezing or shortness of breath. 10/25/18  Yes Rolan Bucco, MD  digoxin (LANOXIN) 0.25 MG tablet Take 0.25 mg by mouth daily.   Yes [provider]  diltiazem (CARDIZEM) 60 MG tablet Take 1 tablet (60 mg total) by mouth 4 (four) times daily. 06/05/16  Yes Rolland Porter, MD  furosemide (LASIX) 20 MG tablet Take 1 tablet (20 mg total) by mouth daily. 06/05/16  Yes Rolland Porter, MD  potassium chloride (K-DUR,KLOR-CON) 10 MEQ tablet Take 10 mEq by mouth 2 (two) times daily.   Yes [provider]  rivaroxaban (XARELTO) 10 MG TABS tablet Take 10 mg by mouth daily.   Yes [provider]  doxycycline (VIBRAMYCIN) 100 MG capsule Take 1 capsule (100 mg total) by mouth 2 (two) times daily. 09/15/18   Gwyneth Sprout, MD    Allergies Patient has no known allergies.   REVIEW OF SYSTEMS  Negative except as noted  here or in the History of Present Illness.   PHYSICAL EXAMINATION  Initial Vital Signs Blood pressure 124/84, pulse 90, temperature 97.9 F (36.6 C), temperature source Oral, resp. rate 20, height 6' (1.829 m), weight 93.7 kg, SpO2 98 %.  Examination General: Well-developed, well-nourished male in no acute distress; appearance consistent with age of record HENT: normocephalic; atraumatic Eyes: Normal appearance Neck: supple Heart: regular rate and rhythm Lungs: clear to auscultation bilaterally Abdomen: soft; nondistended; nontender; bowel sounds present Extremities: No deformity; full range of motion Neurologic: Awake, alert and oriented; motor function intact in all extremities and symmetric; no facial droop Skin: Warm and dry; tender, erythematous lesion left anterior thigh with central pustule:    Psychiatric: Normal mood and affect   RESULTS  Summary of this visit's results, reviewed by myself:   EKG Interpretation  Date/Time:    Ventricular Rate:    PR Interval:    QRS Duration:   QT Interval:    QTC Calculation:   R Axis:     Text Interpretation:        Laboratory Studies: No results found for this or any previous visit (from the past 24 hour(s)). Imaging Studies: No results found.  ED COURSE and MDM  Nursing notes and initial vitals signs, including pulse oximetry, reviewed.  Vitals:   01/13/19 2018 01/13/19 2020  BP:  124/84  Pulse:  90  Resp:  20  Temp:  97.9 F (36.6 C)  TempSrc:  Oral  SpO2:  98%  Weight: 93.7 kg   Height: 6' (1.829 m)    Superficial abscess drained with 18-gauge needle as noted below.  The majority of the lesion is nonfluctuant we will treat with antibiotics.  PROCEDURES   INCISION AND DRAINAGE Performed by: Carlisle Beers Koehn Salehi Consent: Verbal consent obtained. Risks and benefits: risks, benefits and alternatives were discussed Type: abscess  Body area: Left thigh  Anesthesia: None  Incision was made with an 18-gauge  needle.  Complexity: Simple  Drainage: purulent  Drainage amount: Moderate  Patient tolerance: Patient tolerated the procedure well with no immediate complications.   ED DIAGNOSES     ICD-10-CM   1. Cellulitis and abscess of left lower extremity L03.116    L02.416        Nicco Reaume, MD 01/14/19 0001

## 2019-01-13 NOTE — ED Triage Notes (Signed)
Abscess to his left upper leg x 3 days.

## 2019-01-14 MED ORDER — DOXYCYCLINE HYCLATE 100 MG PO CAPS: 100.0000 mg | ORAL_CAPSULE | Freq: Two times a day (BID) | ORAL | 0 refills | Status: DC

## 2019-01-14 MED ORDER — DOXYCYCLINE HYCLATE 100 MG PO TABS
100.0000 mg | ORAL_TABLET | Freq: Once | ORAL | Status: AC
  Administered 2019-01-14: 100 mg via ORAL
  Filled 2019-01-14: qty 1

## 2019-06-03 ENCOUNTER — Emergency Department (HOSPITAL_BASED_OUTPATIENT_CLINIC_OR_DEPARTMENT_OTHER): Payer: Medicaid Other

## 2019-06-03 ENCOUNTER — Emergency Department (HOSPITAL_BASED_OUTPATIENT_CLINIC_OR_DEPARTMENT_OTHER)
Admission: EM | Admit: 2019-06-03 | Discharge: 2019-06-03 | Disposition: A | Discharge status: Home / Self Care | Payer: Medicaid Other | Attending: Emergency Medicine | Admitting: Emergency Medicine

## 2019-06-03 ENCOUNTER — Encounter (HOSPITAL_BASED_OUTPATIENT_CLINIC_OR_DEPARTMENT_OTHER): Payer: Self-pay | Admitting: *Deleted

## 2019-06-03 ENCOUNTER — Other Ambulatory Visit: Payer: Self-pay

## 2019-06-03 DIAGNOSIS — Z7901 Long term (current) use of anticoagulants: Secondary | ICD-10-CM | POA: Insufficient documentation

## 2019-06-03 DIAGNOSIS — M1711 Unilateral primary osteoarthritis, right knee: Secondary | ICD-10-CM

## 2019-06-03 DIAGNOSIS — F17210 Nicotine dependence, cigarettes, uncomplicated: Secondary | ICD-10-CM | POA: Diagnosis not present

## 2019-06-03 DIAGNOSIS — M179 Osteoarthritis of knee, unspecified: Secondary | ICD-10-CM | POA: Insufficient documentation

## 2019-06-03 DIAGNOSIS — R2241 Localized swelling, mass and lump, right lower limb: Secondary | ICD-10-CM | POA: Diagnosis not present

## 2019-06-03 DIAGNOSIS — I4891 Unspecified atrial fibrillation: Secondary | ICD-10-CM | POA: Diagnosis not present

## 2019-06-03 DIAGNOSIS — Z79899 Other long term (current) drug therapy: Secondary | ICD-10-CM | POA: Diagnosis not present

## 2019-06-03 DIAGNOSIS — M25561 Pain in right knee: Secondary | ICD-10-CM | POA: Diagnosis present

## 2019-06-03 MED ORDER — PREDNISONE 10 MG (21) PO TBPK: ORAL_TABLET | ORAL | 0 refills | Status: DC

## 2019-06-03 MED ORDER — CELECOXIB 200 MG PO CAPS: 200.0000 mg | ORAL_CAPSULE | Freq: Two times a day (BID) | ORAL | 0 refills | Status: AC

## 2019-06-03 NOTE — ED Triage Notes (Signed)
Pain behind his right knee. No hx of Bakers cyst.

## 2019-06-03 NOTE — ED Notes (Signed)
Patient transported to Ultrasound 

## 2019-06-03 NOTE — ED Provider Notes (Signed)
[facility] Provider Note   CSN: [id number] Arrival date & time: 06/03/19  2047    History   Chief Complaint Chief Complaint  Patient presents with  . Knee Pain    HPI Tanner Hawkins is a 58 y.o. male.     Pt presents to the ED today with right knee pain.  He said it started yesterday.  He denies any trauma.      Past Medical History:  Diagnosis Date  . Atrial fibrillation ([place])     There are no active problems to display for this patient.   History reviewed. No pertinent surgical history.      Home Medications    Prior to Admission medications   Medication Sig Start Date End Date Taking? Authorizing Provider  albuterol (PROVENTIL) (2.5 MG/3ML) 0.083% nebulizer solution Take 3 mLs (2.5 mg total) by nebulization every 6 (six) hours as needed for wheezing or shortness of breath. 10/25/18  Yes Malvin Johns, MD  digoxin (LANOXIN) 0.25 MG tablet Take 0.25 mg by mouth daily.   Yes [provider]  diltiazem (CARDIZEM) 60 MG tablet Take 1 tablet (60 mg total) by mouth 4 (four) times daily. 06/05/16  Yes Tanna Furry, MD  furosemide (LASIX) 20 MG tablet Take 1 tablet (20 mg total) by mouth daily. 06/05/16  Yes Tanna Furry, MD  potassium chloride (K-DUR,KLOR-CON) 10 MEQ tablet Take 10 mEq by mouth 2 (two) times daily.   Yes [provider]  rivaroxaban (XARELTO) 10 MG TABS tablet Take 10 mg by mouth daily.   Yes [provider]  celecoxib (CELEBREX) 200 MG capsule Take 1 capsule (200 mg total) by mouth 2 (two) times daily. 06/03/19   Isla Pence, MD  doxycycline (VIBRAMYCIN) 100 MG capsule Take 1 capsule (100 mg total) by mouth 2 (two) times daily. 01/14/19   Molpus, John, MD  predniSONE (STERAPRED UNI-PAK 21 TAB) 10 MG (21) TBPK tablet Take 6 tabs for 2 days, then 5 for 2 days, then 4 for 2 days, then 3 for 2 days, 2 for 2 days, then 1 for 2 days 06/03/19   Isla Pence, MD    Family History No family history on  file.  Social History Social History   Tobacco Use  . Smoking status: Current Every Day Smoker    Packs/day: 1.00    Types: Cigarettes  . Smokeless tobacco: Never Used  Substance Use Topics  . Alcohol use: Never    Frequency: Never  . Drug use: Never     Allergies   Patient has no known allergies.   Review of Systems Review of Systems  Musculoskeletal:       Right knee pain  All other systems reviewed and are negative.    Physical Exam Updated Vital Signs BP (!) 163/90 (BP Location: Left Arm)   Pulse 85   Temp 97.9 F (36.6 C) (Oral)   Resp 14   Ht 5\' 11"  (1.803 m)   Wt 90.7 kg   SpO2 100%   BMI 27.89 kg/m   Physical Exam Vitals signs and nursing note reviewed.  Constitutional:      Appearance: Normal appearance.  HENT:     Head: Normocephalic and atraumatic.     Right Ear: External ear normal.     Left Ear: External ear normal.     Nose: Nose normal.     Mouth/Throat:     Mouth: Mucous membranes are moist.     Pharynx: Oropharynx is clear.  Eyes:     Extraocular Movements: Extraocular movements intact.     Conjunctiva/sclera: Conjunctivae normal.     Pupils: Pupils are equal, round, and reactive to light.  Neck:     Musculoskeletal: Normal range of motion and neck supple.  Cardiovascular:     Rate and Rhythm: Normal rate and regular rhythm.     Pulses: Normal pulses.     Heart sounds: Normal heart sounds.  Pulmonary:     Effort: Pulmonary effort is normal.     Breath sounds: Normal breath sounds.  Abdominal:     General: Abdomen is flat. Bowel sounds are normal.     Palpations: Abdomen is soft.  Musculoskeletal:     Right knee: Tenderness found.  Skin:    General: Skin is warm.     Capillary Refill: Capillary refill takes less than 2 seconds.  Neurological:     General: No focal deficit present.     Mental Status: He is alert and oriented to person, place, and time.  Psychiatric:        Mood and Affect: Mood normal.        Behavior:  Behavior normal.        Thought Content: Thought content normal.        Judgment: Judgment normal.      ED Treatments / Results  Labs (all labs ordered are listed, but only abnormal results are displayed) Labs Reviewed - No data to display  EKG None  Radiology [place] Venous Img Lower Right (dvt Study)  Result Date: 06/03/2019 CLINICAL DATA:  Initial evaluation for acute swelling and pain in the right popliteal fossa for 3 days. EXAM: RIGHT LOWER EXTREMITY VENOUS DOPPLER ULTRASOUND TECHNIQUE: Gray-scale sonography with graded compression, as well as color Doppler and duplex ultrasound were performed to evaluate the lower extremity deep venous systems from the level of the common femoral vein and including the common femoral, femoral, profunda femoral, popliteal and calf veins including the posterior tibial, peroneal and gastrocnemius veins when visible. The superficial great saphenous vein was also interrogated. Spectral Doppler was utilized to evaluate flow at rest and with distal augmentation maneuvers in the common femoral, femoral and popliteal veins. COMPARISON:  None. FINDINGS: Contralateral Common Femoral Vein: Respiratory phasicity is normal and symmetric with the symptomatic side. No evidence of thrombus. Normal compressibility. Common Femoral Vein: No evidence of thrombus. Normal compressibility, respiratory phasicity and response to augmentation. Saphenofemoral Junction: No evidence of thrombus. Normal compressibility and flow on color Doppler imaging. Profunda Femoral Vein: No evidence of thrombus. Normal compressibility and flow on color Doppler imaging. Femoral Vein: No evidence of thrombus. Normal compressibility, respiratory phasicity and response to augmentation. Popliteal Vein: No evidence of thrombus. Normal compressibility, respiratory phasicity and response to augmentation. Calf Veins: No evidence of thrombus. Normal compressibility and flow on color Doppler imaging. Superficial  Great Saphenous Vein: No evidence of thrombus. Normal compressibility. Venous Reflux:  None. Other Findings: None. No visible Baker cyst or other collection at the right popliteal fossa. IMPRESSION: 1. No evidence of deep venous thrombosis. 2. No visible baker cyst or other abnormality identified at the right popliteal fossa. Electronically Signed   By: Rise MuBenjamin  McClintock M.D.   On: 06/03/2019 21:52   Dg Knee Complete 4 Views Right  Result Date: 06/03/2019 CLINICAL DATA:  Swelling and pain in the popliteal fossa over the last 3 days. EXAM: RIGHT KNEE - COMPLETE 4+ VIEW COMPARISON:  None. FINDINGS: Mild medial compartment joint space narrowing with marginal osteophytes consistent  with osteoarthritis. Lateral compartment within normal limits except for small marginal spurs. Mild patellofemoral joint narrowing. There is a moderate joint effusion. Baker cyst is not clearly seen by radiography but may be present. No sign of loose bodies. IMPRESSION: Medial compartment more than patellofemoral compartment osteoarthritis. Joint effusion. No sign of loose body. Baker cyst shadow not definitely present, but not excluded. Electronically Signed   By: Paulina FusiMark  Shogry M.D.   On: 06/03/2019 21:57    Procedures Procedures (including critical care time)  Medications Ordered in ED Medications - No data to display   Initial Impression / Assessment and Plan / ED Course  I have reviewed the triage vital signs and the nursing notes.  Pertinent labs & imaging results that were available during my care of the patient were reviewed by me and considered in my medical decision making (see chart for details).     Pt is feeling better. Xrays and US reviewed.  Pt to f/u with ortho.  Return if worse.    Final Clinical Impressions(s) / ED Diagnoses   Final diagnoses:  Osteoarthritis of right knee, unspecified osteoarthritis type    ED Discharge Orders         Ordered    celecoxib (CELEBREX) 200 MG capsule  2 times  daily     06/03/19 2210    predniSONE (STERAPRED UNI-PAK 21 TAB) 10 MG (21) TBPK tablet     06/03/19 2210           Jacalyn LefevreHaviland, Shirline Kendle, MD 06/03/19 2214

## 2019-06-15 ENCOUNTER — Other Ambulatory Visit: Payer: Self-pay

## 2019-06-15 ENCOUNTER — Encounter (HOSPITAL_BASED_OUTPATIENT_CLINIC_OR_DEPARTMENT_OTHER): Payer: Self-pay | Admitting: Emergency Medicine

## 2019-06-15 ENCOUNTER — Emergency Department (HOSPITAL_BASED_OUTPATIENT_CLINIC_OR_DEPARTMENT_OTHER)
Admission: EM | Admit: 2019-06-15 | Discharge: 2019-06-15 | Disposition: A | Discharge status: Home / Self Care | Payer: Medicaid Other | Attending: Emergency Medicine | Admitting: Emergency Medicine

## 2019-06-15 DIAGNOSIS — Z8679 Personal history of other diseases of the circulatory system: Secondary | ICD-10-CM | POA: Insufficient documentation

## 2019-06-15 DIAGNOSIS — M25561 Pain in right knee: Secondary | ICD-10-CM | POA: Diagnosis not present

## 2019-06-15 DIAGNOSIS — L03115 Cellulitis of right lower limb: Secondary | ICD-10-CM | POA: Diagnosis not present

## 2019-06-15 DIAGNOSIS — F17210 Nicotine dependence, cigarettes, uncomplicated: Secondary | ICD-10-CM | POA: Diagnosis not present

## 2019-06-15 DIAGNOSIS — J449 Chronic obstructive pulmonary disease, unspecified: Secondary | ICD-10-CM | POA: Diagnosis not present

## 2019-06-15 DIAGNOSIS — Z7901 Long term (current) use of anticoagulants: Secondary | ICD-10-CM | POA: Insufficient documentation

## 2019-06-15 DIAGNOSIS — Z79899 Other long term (current) drug therapy: Secondary | ICD-10-CM | POA: Diagnosis not present

## 2019-06-15 HISTORY — DX: Chronic obstructive pulmonary disease, unspecified: J44.9

## 2019-06-15 MED ORDER — NAPROXEN 500 MG PO TABS: 500.0000 mg | ORAL_TABLET | Freq: Two times a day (BID) | ORAL | 0 refills | Status: AC

## 2019-06-15 MED ORDER — HYDROCODONE-ACETAMINOPHEN 5-325 MG PO TABS
1.0000 | ORAL_TABLET | Freq: Once | ORAL | Status: AC
  Administered 2019-06-15: 1 via ORAL
  Filled 2019-06-15: qty 1

## 2019-06-15 MED ORDER — CEPHALEXIN 500 MG PO CAPS: 500.0000 mg | ORAL_CAPSULE | Freq: Four times a day (QID) | ORAL | 0 refills | Status: DC

## 2019-06-15 NOTE — Discharge Instructions (Addendum)
You were seen today for knee pain.  Take naproxen for 3 to 5 days to see if this helps.  Follow-up with sports medicine if not improving.  You were noted to have a small area of cellulitis likely related to picking an insect bite.  Take medications as prescribed.  If you develop fever, worsening pain with range of motion, you should be reevaluated.

## 2019-06-15 NOTE — ED Notes (Signed)
ED Provider at bedside. 

## 2019-06-15 NOTE — ED Notes (Addendum)
Pt states he has a ride home

## 2019-06-15 NOTE — ED Triage Notes (Signed)
Recently diagnosed with arthritis  In R knee. States continues to hurt. Tried aleve today, was on prednisone taper - states not effective. Ambulatory.

## 2019-06-15 NOTE — ED Notes (Signed)
Received report

## 2019-06-15 NOTE — ED Provider Notes (Signed)
[facility] Provider Note   CSN: [id number] Arrival date & time: 06/15/19  2233     History   Chief Complaint Chief Complaint  Patient presents with  . Arthritis    HPI Tanner Hawkins is a 58 y.o. male.     HPI  This is a 58 year old male with history of atrial fibrillation and COPD who presents with right knee pain.  Patient reports persistent and ongoing right knee pain.  He was seen and evaluated on July 28.  At that time he was given Celebrex and prednisone.  He states that he ran out of his prednisone.  He feels that he may have had some improvement on prednisone.  Pain is worse at night when he is in bed.  He describes it as achy.  It is mostly in his right knee.  He currently is not with significant pain.  He states that he does not feel like the Celebrex helps much.  He has not noted any fevers.  Denies significant pain when ambulating or ranging his knee.  Of note, he states that he noted a bug bite on his knee 2 days ago and he scratched it and has since noted redness.  Past Medical History:  Diagnosis Date  . Atrial fibrillation ([facility])   . COPD (chronic obstructive pulmonary disease) ([facility])     There are no active problems to display for this patient.   History reviewed. No pertinent surgical history.      Home Medications    Prior to Admission medications   Medication Sig Start Date End Date Taking? Authorizing Provider  albuterol (PROVENTIL) (2.5 MG/3ML) 0.083% nebulizer solution Take 3 mLs (2.5 mg total) by nebulization every 6 (six) hours as needed for wheezing or shortness of breath. 10/25/18   Rolan BuccoBelfi, Melanie, MD  celecoxib (CELEBREX) 200 MG capsule Take 1 capsule (200 mg total) by mouth 2 (two) times daily. 06/03/19   Jacalyn LefevreHaviland, Julie, MD  cephALEXin (KEFLEX) 500 MG capsule Take 1 capsule (500 mg total) by mouth 4 (four) times daily. 06/15/19   Neythan Kozlov, Mayer Maskerourtney F, MD  digoxin (LANOXIN) 0.25 MG tablet Take 0.25 mg by mouth daily.     [provider]  diltiazem (CARDIZEM) 60 MG tablet Take 1 tablet (60 mg total) by mouth 4 (four) times daily. 06/05/16   Rolland PorterJames, Mark, MD  doxycycline (VIBRAMYCIN) 100 MG capsule Take 1 capsule (100 mg total) by mouth 2 (two) times daily. 01/14/19   Molpus, John, MD  furosemide (LASIX) 20 MG tablet Take 1 tablet (20 mg total) by mouth daily. 06/05/16   Rolland PorterJames, Mark, MD  naproxen (NAPROSYN) 500 MG tablet Take 1 tablet (500 mg total) by mouth 2 (two) times daily. Limit use to 3-5 days and do not take in combination with celebrex 06/15/19   Zaid Tomes, Mayer Maskerourtney F, MD  potassium chloride (K-DUR,KLOR-CON) 10 MEQ tablet Take 10 mEq by mouth 2 (two) times daily.    [provider]  predniSONE (STERAPRED UNI-PAK 21 TAB) 10 MG (21) TBPK tablet Take 6 tabs for 2 days, then 5 for 2 days, then 4 for 2 days, then 3 for 2 days, 2 for 2 days, then 1 for 2 days 06/03/19   Jacalyn LefevreHaviland, Julie, MD  rivaroxaban (XARELTO) 10 MG TABS tablet Take 10 mg by mouth daily.    [provider]    Family History History reviewed. No pertinent family history.  Social History Social History   Tobacco Use  . Smoking status: Current  Every Day Smoker    Packs/day: 1.00    Types: Cigarettes  . Smokeless tobacco: Never Used  Substance Use Topics  . Alcohol use: Never    Frequency: Never  . Drug use: Never     Allergies   Patient has no known allergies.   Review of Systems Review of Systems  Constitutional: Negative for fever.  Respiratory: Negative for shortness of breath.   Cardiovascular: Negative for chest pain.  Musculoskeletal:       Right knee pain  Skin: Positive for color change.  Neurological: Negative for weakness and numbness.  All other systems reviewed and are negative.    Physical Exam Updated Vital Signs BP (!) 127/92 (BP Location: Right Arm)   Pulse 99   Temp 97.7 F (36.5 C) (Oral)   Resp 16   Ht 1.803 m (5\' 11" )   Wt 93 kg   SpO2 99%   BMI 28.59 kg/m   Physical Exam  Vitals signs and nursing note reviewed.  Constitutional:      Appearance: He is well-developed. He is not ill-appearing.  HENT:     Head: Normocephalic and atraumatic.  Neck:     Musculoskeletal: Neck supple.  Cardiovascular:     Rate and Rhythm: Normal rate and regular rhythm.  Pulmonary:     Effort: Pulmonary effort is normal. No respiratory distress.  Musculoskeletal:     Comments: Focused examination of the right knee with normal range of motion, no joint line tenderness, no significant effusion, no pain with range of motion, no joint laxity, punctate bite with excoriation noted just medial to the patella with surrounding circular erythema  Lymphadenopathy:     Cervical: No cervical adenopathy.  Skin:    General: Skin is warm and dry.  Neurological:     Mental Status: He is alert and oriented to person, place, and time.  Psychiatric:        Mood and Affect: Mood normal.      ED Treatments / Results  Labs (all labs ordered are listed, but only abnormal results are displayed) Labs Reviewed - No data to display  EKG None  Radiology No results found.  Procedures Procedures (including critical care time)  Medications Ordered in ED Medications  HYDROcodone-acetaminophen (NORCO/VICODIN) 5-325 MG per tablet 1 tablet (has no administration in time range)     Initial Impression / Assessment and Plan / ED Course  I have reviewed the triage vital signs and the nursing notes.  Pertinent labs & imaging results that were available during my care of the patient were reviewed by me and considered in my medical decision making (see chart for details).        Patient presents with continued pain in the right knee.  Diagnosed with arthritis prior visit.  Reported some improvement with prednisone.  On exam, he has a fairly normal knee exam.  No pain with range of motion or effusion suggestive of septic arthritis.  No history of gout.  Patient does have a small area of what  appears to be superficial cellulitis.  I do not feel that this extends more deeply.  Recommend follow-up with sports medicine for ongoing likely arthritis.  He will be placed on a short course of Keflex for cellulitis related to recent insect bite.  After history, exam, and medical workup I feel the patient has been appropriately medically screened and is safe for discharge home. Pertinent diagnoses were discussed with the patient. Patient was given return precautions.  Final Clinical Impressions(s) / ED Diagnoses   Final diagnoses:  Acute pain of right knee  Cellulitis of right lower extremity    ED Discharge Orders         Ordered    naproxen (NAPROSYN) 500 MG tablet  2 times daily     06/15/19 2317    cephALEXin (KEFLEX) 500 MG capsule  4 times daily     06/15/19 2317           Bubba Vanbenschoten, Barbette Hair, MD 06/15/19 2323

## 2019-06-26 ENCOUNTER — Emergency Department (HOSPITAL_BASED_OUTPATIENT_CLINIC_OR_DEPARTMENT_OTHER)
Admission: EM | Admit: 2019-06-26 | Discharge: 2019-06-26 | Disposition: A | Discharge status: Home / Self Care | Payer: Medicaid Other | Attending: Emergency Medicine | Admitting: Emergency Medicine

## 2019-06-26 ENCOUNTER — Other Ambulatory Visit: Payer: Self-pay

## 2019-06-26 ENCOUNTER — Encounter (HOSPITAL_BASED_OUTPATIENT_CLINIC_OR_DEPARTMENT_OTHER): Payer: Self-pay

## 2019-06-26 DIAGNOSIS — Z79899 Other long term (current) drug therapy: Secondary | ICD-10-CM | POA: Diagnosis not present

## 2019-06-26 DIAGNOSIS — G8929 Other chronic pain: Secondary | ICD-10-CM

## 2019-06-26 DIAGNOSIS — Z7901 Long term (current) use of anticoagulants: Secondary | ICD-10-CM | POA: Diagnosis not present

## 2019-06-26 DIAGNOSIS — J449 Chronic obstructive pulmonary disease, unspecified: Secondary | ICD-10-CM | POA: Insufficient documentation

## 2019-06-26 DIAGNOSIS — M25561 Pain in right knee: Secondary | ICD-10-CM | POA: Diagnosis not present

## 2019-06-26 MED ORDER — IBUPROFEN 800 MG PO TABS: 800.0000 mg | ORAL_TABLET | Freq: Three times a day (TID) | ORAL | 0 refills | Status: DC | PRN

## 2019-06-26 MED FILL — IBUPROFEN 800 MG TAB: 800 | 10 days supply | Qty: 30 | Fill #0

## 2019-06-26 NOTE — ED Provider Notes (Signed)
[facility] Provider Note   CSN: [id number] Arrival date & time: 06/26/19  1618     History   Chief Complaint Chief Complaint  Patient presents with  . Leg Pain    HPI Tanner Hawkins is a 58 y.o. male.     The history is provided by the patient.  Knee Pain Location:  Knee Injury: no   Knee location:  R knee Pain details:    Quality:  Aching   Radiates to:  Does not radiate   Severity:  Mild   Onset quality:  Gradual   Timing:  Intermittent   Progression:  Waxing and waning Chronicity:  Recurrent Prior injury to area:  No Relieved by:  Nothing Worsened by:  Nothing Ineffective treatments:  Arthritis medication Associated symptoms: swelling   Associated symptoms: no back pain, no decreased ROM and no fever     Past Medical History:  Diagnosis Date  . Atrial fibrillation ([facility])   . COPD (chronic obstructive pulmonary disease) ([facility])     There are no active problems to display for this patient.   Past Surgical History:  Procedure Laterality Date  . LEG SURGERY Right         Home Medications    Prior to Admission medications   Medication Sig Start Date End Date Taking? Authorizing Provider  albuterol (PROVENTIL) (2.5 MG/3ML) 0.083% nebulizer solution Take 3 mLs (2.5 mg total) by nebulization every 6 (six) hours as needed for wheezing or shortness of breath. 10/25/18   Rolan BuccoBelfi, Melanie, MD  celecoxib (CELEBREX) 200 MG capsule Take 1 capsule (200 mg total) by mouth 2 (two) times daily. 06/03/19   Jacalyn LefevreHaviland, Julie, MD  cephALEXin (KEFLEX) 500 MG capsule Take 1 capsule (500 mg total) by mouth 4 (four) times daily. 06/15/19   Horton, Mayer Maskerourtney F, MD  digoxin (LANOXIN) 0.25 MG tablet Take 0.25 mg by mouth daily.    [provider]  diltiazem (CARDIZEM) 60 MG tablet Take 1 tablet (60 mg total) by mouth 4 (four) times daily. 06/05/16   Rolland PorterJames, Mark, MD  doxycycline (VIBRAMYCIN) 100 MG capsule Take 1 capsule (100 mg total) by mouth 2  (two) times daily. 01/14/19   Molpus, John, MD  furosemide (LASIX) 20 MG tablet Take 1 tablet (20 mg total) by mouth daily. 06/05/16   Rolland PorterJames, Mark, MD  ibuprofen (ADVIL) 800 MG tablet Take 1 tablet (800 mg total) by mouth every 8 (eight) hours as needed for up to 30 doses. 06/26/19   Christeen Lai, DO  naproxen (NAPROSYN) 500 MG tablet Take 1 tablet (500 mg total) by mouth 2 (two) times daily. Limit use to 3-5 days and do not take in combination with celebrex 06/15/19   Horton, Mayer Maskerourtney F, MD  potassium chloride (K-DUR,KLOR-CON) 10 MEQ tablet Take 10 mEq by mouth 2 (two) times daily.    [provider]  predniSONE (STERAPRED UNI-PAK 21 TAB) 10 MG (21) TBPK tablet Take 6 tabs for 2 days, then 5 for 2 days, then 4 for 2 days, then 3 for 2 days, 2 for 2 days, then 1 for 2 days 06/03/19   Jacalyn LefevreHaviland, Julie, MD  rivaroxaban (XARELTO) 10 MG TABS tablet Take 10 mg by mouth daily.    [provider]    Family History No family history on file.  Social History Social History   Tobacco Use  . Smoking status: Current Every Day Smoker    Packs/day: 1.00    Types: Cigarettes  . Smokeless tobacco:  Never Used  Substance Use Topics  . Alcohol use: Never    Frequency: Never  . Drug use: Never     Allergies   Patient has no known allergies.   Review of Systems Review of Systems  Constitutional: Negative for chills and fever.  HENT: Negative for ear pain and sore throat.   Eyes: Negative for pain and visual disturbance.  Respiratory: Negative for cough and shortness of breath.   Cardiovascular: Negative for chest pain and palpitations.  Gastrointestinal: Negative for abdominal pain and vomiting.  Genitourinary: Negative for dysuria and hematuria.  Musculoskeletal: Positive for arthralgias and joint swelling. Negative for back pain.  Skin: Negative for color change and rash.  Neurological: Negative for seizures and syncope.  All other systems reviewed and are negative.     Physical Exam Updated Vital Signs BP (!) 145/83 (BP Location: Left Arm)   Pulse 84   Temp 98.5 F (36.9 C) (Oral)   Resp 14   Ht 5\' 11"  (1.803 m)   Wt 93 kg   SpO2 99%   BMI 28.59 kg/m   Physical Exam Vitals signs and nursing note reviewed.  Constitutional:      Appearance: He is well-developed.  HENT:     Head: Normocephalic and atraumatic.  Eyes:     Conjunctiva/sclera: Conjunctivae normal.  Neck:     Musculoskeletal: Neck supple.  Cardiovascular:     Rate and Rhythm: Normal rate and regular rhythm.     Pulses: Normal pulses.     Heart sounds: No murmur.  Pulmonary:     Effort: Pulmonary effort is normal. No respiratory distress.     Breath sounds: Normal breath sounds.  Abdominal:     Palpations: Abdomen is soft.     Tenderness: There is no abdominal tenderness.  Musculoskeletal: Normal range of motion.        General: Tenderness present. No swelling.     Comments: Tenderness to medial portion of the right knee, no major swelling, normal range of motion, no erythema  Skin:    General: Skin is warm and dry.  Neurological:     Mental Status: He is alert.      ED Treatments / Results  Labs (all labs ordered are listed, but only abnormal results are displayed) Labs Reviewed - No data to display  EKG None  Radiology No results found.  Procedures Procedures (including critical care time)  Medications Ordered in ED Medications - No data to display   Initial Impression / Assessment and Plan / ED Course  I have reviewed the triage vital signs and the nursing notes.  Pertinent labs & imaging results that were available during my care of the patient were reviewed by me and considered in my medical decision making (see chart for details).        Tanner Hawkins is a 58 year old male here with right knee pain.  This is chronic.  X-rays several weeks ago started arthritic changes.  Denies any new trauma.  Has intermittent pain and swelling of the right knee  with activity.  Stands on his feet a lot at work.  No concern for septic joint.  Has some tenderness in the medial portion of the right knee.  Possibly an inflamed bursa or secondary to arthritis.  No concern for blood clot.  Normal range of motion of the knee with minimal pain.  Has been able to ambulate without any issues.  Will give referral to sports medicine.  Will prescribe Motrin.  Given return precautions and discharged from ED in good condition.  This chart was dictated using voice recognition software.  Despite best efforts to proofread,  errors can occur which can change the documentation meaning.    Final Clinical Impressions(s) / ED Diagnoses   Final diagnoses:  Chronic pain of right knee    ED Discharge Orders         Ordered    ibuprofen (ADVIL) 800 MG tablet  Every 8 hours PRN     06/26/19 1645           Breionna Punt, DO 06/26/19 1648

## 2019-06-26 NOTE — ED Notes (Signed)
ED Provider at bedside. 

## 2019-06-26 NOTE — ED Triage Notes (Signed)
Pt c/o R leg pain. Pt states he was seen here the other night and diagnosed with arthritis. When asked if it got worse, pt states "no, I just thought I would come get it checked again."

## 2019-07-02 ENCOUNTER — Other Ambulatory Visit: Payer: Self-pay

## 2019-07-02 ENCOUNTER — Encounter: Payer: Self-pay | Admitting: Family Medicine

## 2019-07-02 ENCOUNTER — Ambulatory Visit: Payer: Medicaid Other | Admitting: Family Medicine

## 2019-07-02 ENCOUNTER — Ambulatory Visit: Payer: Self-pay

## 2019-07-02 VITALS — BP 122/76 | Ht 71.0 in | Wt 210.0 lb

## 2019-07-02 DIAGNOSIS — M179 Osteoarthritis of knee, unspecified: Secondary | ICD-10-CM | POA: Insufficient documentation

## 2019-07-02 DIAGNOSIS — M21371 Foot drop, right foot: Secondary | ICD-10-CM | POA: Diagnosis not present

## 2019-07-02 DIAGNOSIS — M25561 Pain in right knee: Secondary | ICD-10-CM | POA: Diagnosis not present

## 2019-07-02 DIAGNOSIS — G8929 Other chronic pain: Secondary | ICD-10-CM

## 2019-07-02 DIAGNOSIS — M1710 Unilateral primary osteoarthritis, unspecified knee: Secondary | ICD-10-CM | POA: Insufficient documentation

## 2019-07-02 MED ORDER — TRIAMCINOLONE ACETONIDE 40 MG/ML IJ SUSP
40.0000 mg | Freq: Once | INTRAMUSCULAR | Status: AC
  Administered 2019-07-02: 40 mg via INTRA_ARTICULAR

## 2019-07-02 NOTE — Progress Notes (Signed)
Medication Samples have been provided to the patient.  Drug name: Pennsais       Strength: 2%       Qty: 2 Boxes  LOT: [id number]  Exp.Date: 12/2019  Dosing instructions: Use a peasize amount and rub gently.  The patient has been instructed regarding the correct time, dose, and frequency of taking this medication, including desired effects and most common side effects.   Sherrie George, [place] 4:32 PM 07/02/2019

## 2019-07-02 NOTE — Progress Notes (Signed)
Tanner Hawkins - 58 y.o. male MRN [id number]  Date of birth: [birth year]  SUBJECTIVE:  Including CC & ROS.  Chief Complaint  Patient presents with  . Knee Pain    right knee x 1 month    Tanner Hawkins is a 58 y.o. male that is presenting with right knee pain as well as right foot drop.  The knee pain is been acute on chronic in nature.  Is worse over the past few months.  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  He also has repeated bouts of cellulitis over the anterior knee.  He has most of his pain over the medial joint line.  Denies any specific inciting event.  Does have improvement with pain medication.  The pain is localized to the knee.  The pain is worse at night.  Denies any significant mechanical symptoms.  Pain is throbbing in nature.  He reports having a history of a broken ankle or foot for years ago.  Since that time he has been unable to dorsiflex his foot.  He is just been managing this on a regular basis.  Symptoms are constant.  Independent review of the 7/28 of the right knee xrays show mild medial joint space narrowing.    Review of Systems  Constitutional: Negative for fever.  HENT: Negative for congestion.   Respiratory: Negative for cough.   Cardiovascular: Negative for chest pain.  Gastrointestinal: Negative for abdominal pain.  Neurological: Negative for weakness.  Hematological: Negative for adenopathy.    HISTORY: Past Medical, Surgical, Social, and Family History Reviewed & Updated per EMR.   Pertinent Historical Findings include:  Past Medical History:  Diagnosis Date  . Atrial fibrillation ([place])   . COPD (chronic obstructive pulmonary disease) ([place])     Past Surgical History:  Procedure Laterality Date  . LEG SURGERY Right     No Known Allergies  No family history on file.   Social History   Socioeconomic History  . Marital status: Widowed    Spouse name: Not on file  . Number of children: Not on file  .  Years of education: Not on file  . Highest education level: Not on file  Occupational History  . Not on file  Social Needs  . Financial resource strain: Not on file  . Food insecurity    Worry: Not on file    Inability: Not on file  . Transportation needs    Medical: Not on file    Non-medical: Not on file  Tobacco Use  . Smoking status: Current Every Day Smoker    Packs/day: 1.00    Types: Cigarettes  . Smokeless tobacco: Never Used  Substance and Sexual Activity  . Alcohol use: Never    Frequency: Never  . Drug use: Never  . Sexual activity: Not on file  Lifestyle  . Physical activity    Days per week: Not on file    Minutes per session: Not on file  . Stress: Not on file  Relationships  . Social Herbalist on phone: Not on file    Gets together: Not on file    Attends religious service: Not on file    Active member of club or organization: Not on file    Attends meetings of clubs or organizations: Not on file    Relationship status: Not on file  . Intimate partner violence    Fear of current or ex partner: Not  on file    Emotionally abused: Not on file    Physically abused: Not on file    Forced sexual activity: Not on file  Other Topics Concern  . Not on file  Social History Narrative  . Not on file     PHYSICAL EXAM:  VS: BP 122/76   Ht 5\' 11"  (1.803 m)   Wt 210 lb (95.3 kg)   BMI 29.29 kg/m  Physical Exam Gen: NAD, alert, cooperative with exam, well-appearing ENT: normal lips, normal nasal mucosa,  Eye: normal EOM, normal conjunctiva and lids CV:  no edema, +2 pedal pulses   Resp: no accessory muscle use, non-labored,   Skin: no rashes, no areas of induration  Neuro: normal tone, normal sensation to touch Psych:  normal insight, alert and oriented MSK:  Right knee: Mild effusion. Tenderness palpation over the medial joint line. Callus formation over the anterior patella. Normal range of motion. Normal strength resistance. Negative  McMurray's test. No pain with patellar grind. Right foot. No strength against gravity with dorsiflexion. Normal plantarflexion. Neurovascular intact  Limited ultrasound: Right knee:  Mild to moderate medial joint space degenerative changes. Callus formation over the anterior knee shows a hollow cavity but no foreign body  Summary: Mild to moderate degenerative changes.  Ultrasound and interpretation by Clare GandyJeremy Asael Pann, MD   Aspiration/Injection Procedure Note Gerlene Burdockrthur Kohl Feb 22, 1961  Procedure: Injection Indications: Right knee pain  Procedure Details Consent: Risks of procedure as well as the alternatives and risks of each were explained to the (patient/caregiver).  Consent for procedure obtained. Time Out: Verified patient identification, verified procedure, site/side was marked, verified correct patient position, special equipment/implants available, medications/allergies/relevent history reviewed, required imaging and test results available.  Performed.  The area was cleaned with iodine and alcohol swabs.    The right knee superior lateral suprapatellar pouch was injected using 1 cc's of 40 mg Kenalog and 4 cc's of 0.25% bupivacaine with a 22 1 1/2" needle.  Ultrasound was used. Images were obtained in long views showing the injection.     A sterile dressing was applied.  Patient did tolerate procedure well.      ASSESSMENT & PLAN:   Right foot drop Chronic since his ankle fracture.  - AFO script provided   Chronic pain of right knee Pain seems more related to degenerative changes. Does have a callus formation on the anterior patella. Possible source for infection.  - infection  - counseled on HEP and supportive  - counseled on callus  - provided samples of pennsaid  - if no improvement consider gel injections.

## 2019-07-02 NOTE — Patient Instructions (Signed)
Nice to meet you Please try ice on the knee  Please use tylenol  Please try to soak the callus area and then use a pumice stone  Please try the exercises   Please send me a message in MyChart with any questions or updates.  Please see me back in 4 weeks.   --Dr. Raeford Razor

## 2019-07-02 NOTE — Assessment & Plan Note (Signed)
Pain seems more related to degenerative changes. Does have a callus formation on the anterior patella. Possible source for infection.  - infection  - counseled on HEP and supportive  - counseled on callus  - provided samples of pennsaid  - if no improvement consider gel injections.

## 2019-07-02 NOTE — Assessment & Plan Note (Signed)
Chronic since his ankle fracture.  - AFO script provided

## 2019-07-11 ENCOUNTER — Other Ambulatory Visit: Payer: Self-pay | Admitting: Family Medicine

## 2019-07-11 ENCOUNTER — Telehealth: Payer: Self-pay | Admitting: Family Medicine

## 2019-07-11 MED ORDER — IBUPROFEN 800 MG PO TABS: 800.0000 mg | ORAL_TABLET | Freq: Three times a day (TID) | ORAL | 1 refills | Status: DC | PRN

## 2019-07-11 NOTE — Telephone Encounter (Signed)
Left VM for patient. If he calls back please have him speak with a nurse/CMA and inform that he can try tylenol. We can consider a prescription anti-inflammatory or gel injections.   If any questions then please take the best time and phone number to call and I will try to call him back.   Rosemarie Ax, MD [facility] 07/11/2019, 11:03 AM

## 2019-07-11 NOTE — Telephone Encounter (Signed)
Patient requesting medication to help with the pain in his right knee   Preferred Pharmacy: [facility] on [address]

## 2019-07-30 ENCOUNTER — Ambulatory Visit: Payer: Medicaid Other | Admitting: Family Medicine

## 2019-07-30 NOTE — Progress Notes (Deleted)
  Tanner Hawkins - 58 y.o. male MRN [id number]  Date of birth: 1961/04/29  SUBJECTIVE:  Including CC & ROS.  No chief complaint on file.   Tanner Hawkins is a 58 y.o. male that is  ***.  ***   Review of Systems  HISTORY: Past Medical, Surgical, Social, and Family History Reviewed & Updated per EMR.   Pertinent Historical Findings include:  Past Medical History:  Diagnosis Date  . Atrial fibrillation ([place])   . COPD (chronic obstructive pulmonary disease) ([place])     Past Surgical History:  Procedure Laterality Date  . LEG SURGERY Right     No Known Allergies  No family history on file.   Social History   Socioeconomic History  . Marital status: Widowed    Spouse name: Not on file  . Number of children: Not on file  . Years of education: Not on file  . Highest education level: Not on file  Occupational History  . Not on file  Social Needs  . Financial resource strain: Not on file  . Food insecurity    Worry: Not on file    Inability: Not on file  . Transportation needs    Medical: Not on file    Non-medical: Not on file  Tobacco Use  . Smoking status: Current Every Day Smoker    Packs/day: 1.00    Types: Cigarettes  . Smokeless tobacco: Never Used  Substance and Sexual Activity  . Alcohol use: Never    Frequency: Never  . Drug use: Never  . Sexual activity: Not on file  Lifestyle  . Physical activity    Days per week: Not on file    Minutes per session: Not on file  . Stress: Not on file  Relationships  . Social Herbalist on phone: Not on file    Gets together: Not on file    Attends religious service: Not on file    Active member of club or organization: Not on file    Attends meetings of clubs or organizations: Not on file    Relationship status: Not on file  . Intimate partner violence    Fear of current or ex partner: Not on file    Emotionally abused: Not on file    Physically abused: Not on file    Forced sexual activity: Not on file   Other Topics Concern  . Not on file  Social History Narrative  . Not on file     PHYSICAL EXAM:  VS: There were no vitals taken for this visit. Physical Exam Gen: NAD, alert, cooperative with exam, well-appearing ENT: normal lips, normal nasal mucosa,  Eye: normal EOM, normal conjunctiva and lids CV:  no edema, +2 pedal pulses   Resp: no accessory muscle use, non-labored,  GI: no masses or tenderness, no hernia  Skin: no rashes, no areas of induration  Neuro: normal tone, normal sensation to touch Psych:  normal insight, alert and oriented MSK:  ***      ASSESSMENT & PLAN:   No problem-specific Assessment & Plan notes found for this encounter.

## 2019-07-31 ENCOUNTER — Encounter: Payer: Self-pay | Admitting: Family Medicine

## 2019-07-31 ENCOUNTER — Ambulatory Visit: Payer: Medicaid Other | Admitting: Family Medicine

## 2019-07-31 ENCOUNTER — Other Ambulatory Visit: Payer: Self-pay

## 2019-07-31 DIAGNOSIS — M1711 Unilateral primary osteoarthritis, right knee: Secondary | ICD-10-CM

## 2019-07-31 DIAGNOSIS — M21371 Foot drop, right foot: Secondary | ICD-10-CM | POA: Diagnosis present

## 2019-07-31 NOTE — Progress Notes (Signed)
Tanner Hawkins - 58 y.o. male MRN [id number]  Date of birth: [birth year]  SUBJECTIVE:  Including CC & ROS.  Chief Complaint  Patient presents with  . Follow-up    follow up for right knee    Tanner Hawkins is a 58 y.o. male that is following up for his knee pain and foot drop.  He received an injection on 8/26 and reports improvement of his knee pain.  Pain is intermittent in nature.  Pain can be mild to moderate.  No mechanical symptoms.  He obtained the AFO but this caused a blister on the medial aspect of his foot.  He has foot drop in this area.  Denies any redness or swelling.  Has not walked this well in years.   Review of Systems  Constitutional: Negative for fever.  HENT: Negative for congestion.   Respiratory: Negative for cough.   Cardiovascular: Negative for chest pain.  Gastrointestinal: Negative for abdominal pain.  Musculoskeletal: Positive for arthralgias and gait problem.  Neurological: Positive for numbness.  Hematological: Negative for adenopathy.    HISTORY: Past Medical, Surgical, Social, and Family History Reviewed & Updated per EMR.   Pertinent Historical Findings include:  Past Medical History:  Diagnosis Date  . Atrial fibrillation ([facility])   . COPD (chronic obstructive pulmonary disease) ([facility])     Past Surgical History:  Procedure Laterality Date  . LEG SURGERY Right     No Known Allergies  No family history on file.   Social History   Socioeconomic History  . Marital status: Widowed    Spouse name: Not on file  . Number of children: Not on file  . Years of education: Not on file  . Highest education level: Not on file  Occupational History  . Not on file  Social Needs  . Financial resource strain: Not on file  . Food insecurity    Worry: Not on file    Inability: Not on file  . Transportation needs    Medical: Not on file    Non-medical: Not on file  Tobacco Use  . Smoking status: Current Every Day Smoker    Packs/day: 1.00    Types:  Cigarettes  . Smokeless tobacco: Never Used  Substance and Sexual Activity  . Alcohol use: Never    Frequency: Never  . Drug use: Never  . Sexual activity: Not on file  Lifestyle  . Physical activity    Days per week: Not on file    Minutes per session: Not on file  . Stress: Not on file  Relationships  . Social Musician on phone: Not on file    Gets together: Not on file    Attends religious service: Not on file    Active member of club or organization: Not on file    Attends meetings of clubs or organizations: Not on file    Relationship status: Not on file  . Intimate partner violence    Fear of current or ex partner: Not on file    Emotionally abused: Not on file    Physically abused: Not on file    Forced sexual activity: Not on file  Other Topics Concern  . Not on file  Social History Narrative  . Not on file     PHYSICAL EXAM:  VS: BP 126/83   Ht 5\' 11"  (1.803 m)   Wt 205 lb (93 kg)   BMI 28.59 kg/m  Physical Exam Gen: NAD, alert, cooperative with exam,  well-appearing ENT: normal lips, normal nasal mucosa,  Eye: normal EOM, normal conjunctiva and lids CV:  no edema, +2 pedal pulses   Resp: no accessory muscle use, non-labored,  Skin: no rashes, no areas of induration  Neuro: normal tone, normal sensation to touch Psych:  normal insight, alert and oriented MSK:  Right knee: No tenderness palpation of the medial joint line. Normal range of motion. No obvious effusion. Right foot: Significant weakness to resistance and dorsiflexion and plantarflexion. Circular blister over the medial longitudinal arch.  No redness or streaking. Neurovascular intact     ASSESSMENT & PLAN:   Right foot drop Obtained the AFO but developed a blister as he is pes planus bilaterally. -Provided bacitracin. -Dressed the blister and counseled on its management. -Provided scaphoid pads bilaterally and placed will on the AFO -Advised follow-up in 2 weeks   OA  (osteoarthritis) of knee Has had improvement with the steroid injection.  Cannot currently get gel injections at this time. -Provided samples of Pennsaid. -Counseled on home exercise therapy and supportive care. -Provided with temporary handicap placard. -Could consider Toradol intra-articular injection.

## 2019-07-31 NOTE — Assessment & Plan Note (Signed)
Obtained the AFO but developed a blister as he is pes planus bilaterally. -Provided bacitracin. -Dressed the blister and counseled on its management. -Provided scaphoid pads bilaterally and placed will on the AFO -Advised follow-up in 2 weeks

## 2019-07-31 NOTE — Progress Notes (Signed)
Medication Samples have been provided to the patient.  Drug name: Pennsaid      Strength: 2%        Qty: 2 Boxes  LOT: [id number]  Exp.Date: 12/2019  Dosing instructions: Use a peasize amount and rub gently.  The patient has been instructed regarding the correct time, dose, and frequency of taking this medication, including desired effects and most common side effects.   Sherrie George, [place] 4:36 PM 07/31/2019

## 2019-07-31 NOTE — Patient Instructions (Signed)
Good to see you  Please change the dressing on a daily basis.  Please let me know if it becomes red or becomes more painful.  Please send me a message in MyChart with any questions or updates.  Please see me back in 2 weeks.   --Dr. Raeford Razor

## 2019-07-31 NOTE — Assessment & Plan Note (Signed)
Has had improvement with the steroid injection.  Cannot currently get gel injections at this time. -Provided samples of Pennsaid. -Counseled on home exercise therapy and supportive care. -Provided with temporary handicap placard. -Could consider Toradol intra-articular injection.

## 2019-08-14 ENCOUNTER — Ambulatory Visit: Payer: Medicaid Other | Admitting: Family Medicine

## 2019-08-14 NOTE — Progress Notes (Deleted)
  Sylis Nickles - 58 y.o. male MRN [id number]  Date of birth: 09/02/1961  SUBJECTIVE:  Including CC & ROS.  No chief complaint on file.   Arael Valladolid is a 58 y.o. male that is  ***.  ***   Review of Systems  HISTORY: Past Medical, Surgical, Social, and Family History Reviewed & Updated per EMR.   Pertinent Historical Findings include:  Past Medical History:  Diagnosis Date  . Atrial fibrillation ([facility])   . COPD (chronic obstructive pulmonary disease) ([facility])     Past Surgical History:  Procedure Laterality Date  . LEG SURGERY Right     No Known Allergies  No family history on file.   Social History   Socioeconomic History  . Marital status: Widowed    Spouse name: Not on file  . Number of children: Not on file  . Years of education: Not on file  . Highest education level: Not on file  Occupational History  . Not on file  Social Needs  . Financial resource strain: Not on file  . Food insecurity    Worry: Not on file    Inability: Not on file  . Transportation needs    Medical: Not on file    Non-medical: Not on file  Tobacco Use  . Smoking status: Current Every Day Smoker    Packs/day: 1.00    Types: Cigarettes  . Smokeless tobacco: Never Used  Substance and Sexual Activity  . Alcohol use: Never    Frequency: Never  . Drug use: Never  . Sexual activity: Not on file  Lifestyle  . Physical activity    Days per week: Not on file    Minutes per session: Not on file  . Stress: Not on file  Relationships  . Social connections    Talks on phone: Not on file    Gets together: Not on file    Attends religious service: Not on file    Active member of club or organization: Not on file    Attends meetings of clubs or organizations: Not on file    Relationship status: Not on file  . Intimate partner violence    Fear of current or ex partner: Not on file    Emotionally abused: Not on file    Physically abused: Not on file    Forced sexual activity: Not on file   Other Topics Concern  . Not on file  Social History Narrative  . Not on file     PHYSICAL EXAM:  VS: There were no vitals taken for this visit. Physical Exam Gen: NAD, alert, cooperative with exam, well-appearing ENT: normal lips, normal nasal mucosa,  Eye: normal EOM, normal conjunctiva and lids CV:  no edema, +2 pedal pulses   Resp: no accessory muscle use, non-labored,  GI: no masses or tenderness, no hernia  Skin: no rashes, no areas of induration  Neuro: normal tone, normal sensation to touch Psych:  normal insight, alert and oriented MSK:  ***      ASSESSMENT & PLAN:   No problem-specific Assessment & Plan notes found for this encounter.     

## 2019-09-26 ENCOUNTER — Emergency Department (HOSPITAL_BASED_OUTPATIENT_CLINIC_OR_DEPARTMENT_OTHER): Payer: Medicaid Other

## 2019-09-26 ENCOUNTER — Other Ambulatory Visit: Payer: Self-pay

## 2019-09-26 ENCOUNTER — Emergency Department (HOSPITAL_BASED_OUTPATIENT_CLINIC_OR_DEPARTMENT_OTHER)
Admission: EM | Admit: 2019-09-26 | Discharge: 2019-09-26 | Disposition: A | Discharge status: Home / Self Care | Payer: Medicaid Other | Attending: Emergency Medicine | Admitting: Emergency Medicine

## 2019-09-26 ENCOUNTER — Encounter (HOSPITAL_BASED_OUTPATIENT_CLINIC_OR_DEPARTMENT_OTHER): Payer: Self-pay

## 2019-09-26 DIAGNOSIS — S61411A Laceration without foreign body of right hand, initial encounter: Secondary | ICD-10-CM | POA: Diagnosis present

## 2019-09-26 DIAGNOSIS — T754XXA Electrocution, initial encounter: Secondary | ICD-10-CM | POA: Insufficient documentation

## 2019-09-26 DIAGNOSIS — Y92009 Unspecified place in unspecified non-institutional (private) residence as the place of occurrence of the external cause: Secondary | ICD-10-CM | POA: Insufficient documentation

## 2019-09-26 DIAGNOSIS — W010XXA Fall on same level from slipping, tripping and stumbling without subsequent striking against object, initial encounter: Secondary | ICD-10-CM | POA: Diagnosis not present

## 2019-09-26 DIAGNOSIS — F17210 Nicotine dependence, cigarettes, uncomplicated: Secondary | ICD-10-CM | POA: Diagnosis not present

## 2019-09-26 DIAGNOSIS — I4891 Unspecified atrial fibrillation: Secondary | ICD-10-CM | POA: Diagnosis not present

## 2019-09-26 DIAGNOSIS — Z7901 Long term (current) use of anticoagulants: Secondary | ICD-10-CM | POA: Insufficient documentation

## 2019-09-26 DIAGNOSIS — Z79899 Other long term (current) drug therapy: Secondary | ICD-10-CM | POA: Insufficient documentation

## 2019-09-26 DIAGNOSIS — J449 Chronic obstructive pulmonary disease, unspecified: Secondary | ICD-10-CM | POA: Insufficient documentation

## 2019-09-26 DIAGNOSIS — S63502A Unspecified sprain of left wrist, initial encounter: Secondary | ICD-10-CM | POA: Insufficient documentation

## 2019-09-26 DIAGNOSIS — Y998 Other external cause status: Secondary | ICD-10-CM | POA: Diagnosis not present

## 2019-09-26 DIAGNOSIS — Y939 Activity, unspecified: Secondary | ICD-10-CM | POA: Insufficient documentation

## 2019-09-26 MED ORDER — LIDOCAINE-EPINEPHRINE 1 %-1:100000 IJ SOLN
10.0000 mL | Freq: Once | INTRAMUSCULAR | Status: AC
  Administered 2019-09-26: 10 mL
  Filled 2019-09-26: qty 1

## 2019-09-26 NOTE — ED Notes (Signed)
Pt on monitor 

## 2019-09-26 NOTE — ED Provider Notes (Addendum)
[facility] EMERGENCY DEPARTMENT Provider Note   CSN: [id number] Arrival date & time: 09/26/19  1148     History   Chief Complaint Chief Complaint  Patient presents with  . Electric Shock  . Hand Injury    HPI Tanner Hawkins is a 58 y.o. male.     58 year old male with past medical history including atrial fibrillation on anticoagulation, COPD who presents with hand injury.  Just prior to arrival, the patient was plugging in on icemaker on a refrigerator and was shocked on his right fingertips.  He tried to get away and ended up falling, cutting his right hand in the process. He reports L wrist pain but no R joint pains. No CP, SOB or other complaints. He did not lose consciousness. Tetanus UTD.   The history is provided by the patient.  Hand Injury   Past Medical History:  Diagnosis Date  . Atrial fibrillation (HCC)   . COPD (chronic obstructive pulmonary disease) [facility](HCC)     Patient Active Problem List   Diagnosis Date Noted  . OA (osteoarthritis) of knee 07/02/2019  . Right foot drop 07/02/2019    Past Surgical History:  Procedure Laterality Date  . LEG SURGERY Right         Home Medications    Prior to Admission medications   Medication Sig Start Date End Date Taking? Authorizing Provider  albuterol (PROVENTIL) (2.5 MG/3ML) 0.083% nebulizer solution Take 3 mLs (2.5 mg total) by nebulization every 6 (six) hours as needed for wheezing or shortness of breath. 10/25/18   Rolan BuccoBelfi, Melanie, MD  celecoxib (CELEBREX) 200 MG capsule Take 1 capsule (200 mg total) by mouth 2 (two) times daily. 06/03/19   Jacalyn LefevreHaviland, Julie, MD  cephALEXin (KEFLEX) 500 MG capsule Take 1 capsule (500 mg total) by mouth 4 (four) times daily. 06/15/19   Horton, Mayer Maskerourtney F, MD  digoxin (LANOXIN) 0.25 MG tablet Take 0.25 mg by mouth daily.    [provider]  diltiazem (CARDIZEM) 60 MG tablet Take 1 tablet (60 mg total) by mouth 4 (four) times daily. 06/05/16   Rolland PorterJames, Mark, MD   doxycycline (VIBRAMYCIN) 100 MG capsule Take 1 capsule (100 mg total) by mouth 2 (two) times daily. 01/14/19   Molpus, John, MD  furosemide (LASIX) 20 MG tablet Take 1 tablet (20 mg total) by mouth daily. 06/05/16   Rolland PorterJames, Mark, MD  naproxen (NAPROSYN) 500 MG tablet Take 1 tablet (500 mg total) by mouth 2 (two) times daily. Limit use to 3-5 days and do not take in combination with celebrex 06/15/19   Horton, Mayer Maskerourtney F, MD  potassium chloride (K-DUR,KLOR-CON) 10 MEQ tablet Take 10 mEq by mouth 2 (two) times daily.    [provider]  predniSONE (STERAPRED UNI-PAK 21 TAB) 10 MG (21) TBPK tablet Take 6 tabs for 2 days, then 5 for 2 days, then 4 for 2 days, then 3 for 2 days, 2 for 2 days, then 1 for 2 days 06/03/19   Jacalyn LefevreHaviland, Julie, MD  rivaroxaban (XARELTO) 10 MG TABS tablet Take 10 mg by mouth daily.    [provider]    Family History No family history on file.  Social History Social History   Tobacco Use  . Smoking status: Current Every Day Smoker    Packs/day: 1.00    Types: Cigarettes  . Smokeless tobacco: Never Used  Substance Use Topics  . Alcohol use: Never    Frequency: Never  . Drug use: Never  Allergies   Patient has no known allergies.   Review of Systems Review of Systems All other systems reviewed and are negative except that which was mentioned in HPI   Physical Exam Updated Vital Signs BP (!) 146/98 (BP Location: Left Arm)   Pulse 96   Temp 98.7 F (37.1 C) (Oral)   Resp 20   Ht 5\' 11"  (1.803 m)   Wt 90.3 kg   SpO2 98%   BMI 27.75 kg/m   Physical Exam Vitals signs and nursing note reviewed.  Constitutional:      General: He is not in acute distress.    Appearance: He is well-developed.  HENT:     Head: Normocephalic and atraumatic.  Eyes:     Conjunctiva/sclera: Conjunctivae normal.  Neck:     Musculoskeletal: Neck supple.  Cardiovascular:     Rate and Rhythm: Rhythm irregular.     Pulses: Normal pulses.  Musculoskeletal:         General: No swelling or deformity.     Comments: Tenderness L volar side of wrist with normal ROM and no obvious swelling, mild tenderness of proximal dorsal L hand; 4cm laceration on thenar eminence of R hand with no tendon exposure; normal ROM L thumb and fingers  Skin:    General: Skin is warm and dry.  Neurological:     Mental Status: He is alert and oriented to person, place, and time.     Sensory: No sensory deficit.  Psychiatric:        Judgment: Judgment normal.      ED Treatments / Results  Labs (all labs ordered are listed, but only abnormal results are displayed) Labs Reviewed - No data to display  EKG EKG Interpretation  Date/Time:  Friday September 26 2019 12:06:47 EST Ventricular Rate:  121 PR Interval:    QRS Duration: 91 QT Interval:  318 QTC Calculation: 452 R Axis:   -92 Text Interpretation: Atrial fibrillation Markedly posterior QRS axis Baseline wander in lead(s) V3 similar to previous Confirmed by Theotis Burrow [phone]) on 09/26/2019 12:18:10 PM   Radiology Dg Wrist Complete Left  Result Date: 09/26/2019 CLINICAL DATA:  Left wrist and hand pain after fall EXAM: LEFT WRIST - COMPLETE 3+ VIEW; LEFT HAND - COMPLETE 3+ VIEW COMPARISON:  None. FINDINGS: Alignment is anatomic. There is no acute fracture. Joint spaces are preserved. There are mild degenerative changes at the DIP joints. No radiopaque foreign body. IMPRESSION: No acute fracture. Electronically Signed   By: Macy Mis M.D.   On: 09/26/2019 12:43   Dg Hand Complete Left  Result Date: 09/26/2019 CLINICAL DATA:  Left wrist and hand pain after fall EXAM: LEFT WRIST - COMPLETE 3+ VIEW; LEFT HAND - COMPLETE 3+ VIEW COMPARISON:  None. FINDINGS: Alignment is anatomic. There is no acute fracture. Joint spaces are preserved. There are mild degenerative changes at the DIP joints. No radiopaque foreign body. IMPRESSION: No acute fracture. Electronically Signed   By: Macy Mis M.D.   On:  09/26/2019 12:43    Procedures .Marland KitchenLaceration Repair  Date/Time: 09/26/2019 1:19 PM Performed by: Sharlett Iles, MD Authorized by: Sharlett Iles, MD   Consent:    Consent obtained:  Verbal   Consent given by:  Patient   Risks discussed:  Infection and pain   Alternatives discussed:  No treatment Anesthesia (see MAR for exact dosages):    Anesthesia method:  Local infiltration   Local anesthetic:  Lidocaine 1% WITH epi Laceration details:  Location:  Hand   Hand location:  R palm   Length (cm):  4 Repair type:    Repair type:  Simple Pre-procedure details:    Preparation:  Patient was prepped and draped in usual sterile fashion Treatment:    Area cleansed with:  Betadine   Amount of cleaning:  Standard   Irrigation solution:  Sterile saline   Irrigation volume:  250   Irrigation method:  Pressure wash Skin repair:    Repair method:  Sutures   Suture size:  4-0   Suture material:  Nylon   Suture technique:  Simple interrupted   Number of sutures:  6 Approximation:    Approximation:  Close Post-procedure details:    Dressing:  Antibiotic ointment, non-adherent dressing and splint for protection   Patient tolerance of procedure:  Tolerated well, no immediate complications   (including critical care time)  Medications Ordered in ED Medications  lidocaine-EPINEPHrine (XYLOCAINE W/EPI) 1 %-1:100000 (with pres) injection 10 mL (10 mLs Other Given by Other 09/26/19 1222)     Initial Impression / Assessment and Plan / ED Course  I have reviewed the triage vital signs and the nursing notes.  Pertinent imaging results that were available during my care of the patient were reviewed by me and considered in my medical decision making (see chart for details).       EKG shows A fib similar to previous. Denying any complaints to suggest life-threatening electric shock. HR has been variable, sometimes over 100 but I suspect this is partially related to pain  and anxiety over event. He is compliant w/ meds. Lac repaired at bedside, see procedure note. Placed in splint for protection and discussed wound care. L wrist with normal XR, discussed supportive measures for wrist sprain. Discussed f/u in 10d for suture removal. Return precautions reviewed.  Final Clinical Impressions(s) / ED Diagnoses   Final diagnoses:  Laceration of right palm, initial encounter  Sprain of left wrist, initial encounter  Electrocution and nonfatal effects of electric current, initial encounter    ED Discharge Orders    None       Little, Ambrose Finland, MD 09/26/19 1320    Little, Ambrose Finland, MD 09/26/19 1321

## 2019-09-26 NOTE — ED Triage Notes (Signed)
Pt was plugging in ice maker on refrigerator-was shocked and fell to the ground-lac to right hand-abrasion to right and left wrist-NAD-steady gait

## 2019-09-26 NOTE — ED Notes (Signed)
ED Provider at bedside. 

## 2019-10-27 ENCOUNTER — Other Ambulatory Visit: Payer: Self-pay

## 2019-10-27 ENCOUNTER — Emergency Department (HOSPITAL_BASED_OUTPATIENT_CLINIC_OR_DEPARTMENT_OTHER): Payer: Medicaid Other

## 2019-10-27 ENCOUNTER — Emergency Department (HOSPITAL_BASED_OUTPATIENT_CLINIC_OR_DEPARTMENT_OTHER)
Admission: EM | Admit: 2019-10-27 | Discharge: 2019-10-27 | Disposition: A | Discharge status: Home / Self Care | Payer: Medicaid Other | Attending: Emergency Medicine | Admitting: Emergency Medicine

## 2019-10-27 ENCOUNTER — Encounter (HOSPITAL_BASED_OUTPATIENT_CLINIC_OR_DEPARTMENT_OTHER): Payer: Self-pay | Admitting: *Deleted

## 2019-10-27 DIAGNOSIS — F17210 Nicotine dependence, cigarettes, uncomplicated: Secondary | ICD-10-CM | POA: Diagnosis not present

## 2019-10-27 DIAGNOSIS — Z7901 Long term (current) use of anticoagulants: Secondary | ICD-10-CM | POA: Diagnosis not present

## 2019-10-27 DIAGNOSIS — J449 Chronic obstructive pulmonary disease, unspecified: Secondary | ICD-10-CM | POA: Insufficient documentation

## 2019-10-27 DIAGNOSIS — Z79899 Other long term (current) drug therapy: Secondary | ICD-10-CM | POA: Diagnosis not present

## 2019-10-27 DIAGNOSIS — M25532 Pain in left wrist: Secondary | ICD-10-CM

## 2019-10-27 DIAGNOSIS — L729 Follicular cyst of the skin and subcutaneous tissue, unspecified: Secondary | ICD-10-CM | POA: Diagnosis not present

## 2019-10-27 MED ORDER — DOXYCYCLINE HYCLATE 100 MG PO CAPS: 100.0000 mg | ORAL_CAPSULE | Freq: Two times a day (BID) | ORAL | 0 refills | Status: DC

## 2019-10-27 NOTE — ED Triage Notes (Signed)
Pt c/o left hand injury x 1 month ago seen here for same in NOV, no improvement

## 2019-10-27 NOTE — ED Provider Notes (Signed)
[facility] EMERGENCY DEPARTMENT Provider Note   CSN: [id number] Arrival date & time: 10/27/19  1739     History Chief Complaint  Patient presents with  . Hand Pain    Tanner Hawkins is a 58 y.o. male.  Patient seen November 20 following a fall with a laceration to his right palm area.  An injury to his left wrist.  X-rays at the time were negative.  But patient is having persistent left wrist pain.  Predominantly on the dorsum of the wrist.  States is just not getting any better.  Patient also with complaint of multiple skin cyst has had trouble with that in the past.  He tends to get these abscesses.  Has been treated with doxycycline in the past for that.  But not recently.        Past Medical History:  Diagnosis Date  . Atrial fibrillation (HCC)   . COPD (chronic obstructive pulmonary disease) [facility])     Patient Active Problem List   Diagnosis Date Noted  . OA (osteoarthritis) of knee 07/02/2019  . Right foot drop 07/02/2019    Past Surgical History:  Procedure Laterality Date  . LEG SURGERY Right        No family history on file.  Social History   Tobacco Use  . Smoking status: Current Every Day Smoker    Packs/day: 1.00    Types: Cigarettes  . Smokeless tobacco: Never Used  Substance Use Topics  . Alcohol use: Never  . Drug use: Never    Home Medications Prior to Admission medications   Medication Sig Start Date End Date Taking? Authorizing Provider  albuterol (PROVENTIL) (2.5 MG/3ML) 0.083% nebulizer solution Take 3 mLs (2.5 mg total) by nebulization every 6 (six) hours as needed for wheezing or shortness of breath. 10/25/18   Rolan Bucco, MD  celecoxib (CELEBREX) 200 MG capsule Take 1 capsule (200 mg total) by mouth 2 (two) times daily. 06/03/19   Jacalyn Lefevre, MD  cephALEXin (KEFLEX) 500 MG capsule Take 1 capsule (500 mg total) by mouth 4 (four) times daily. 06/15/19   Horton, Mayer Masker, MD  digoxin (LANOXIN) 0.25 MG tablet Take 0.25  mg by mouth daily.    [provider]  diltiazem (CARDIZEM) 60 MG tablet Take 1 tablet (60 mg total) by mouth 4 (four) times daily. 06/05/16   Rolland Porter, MD  doxycycline (VIBRAMYCIN) 100 MG capsule Take 1 capsule (100 mg total) by mouth 2 (two) times daily. 01/14/19   Molpus, John, MD  doxycycline (VIBRAMYCIN) 100 MG capsule Take 1 capsule (100 mg total) by mouth 2 (two) times daily. 10/27/19   Vanetta Mulders, MD  furosemide (LASIX) 20 MG tablet Take 1 tablet (20 mg total) by mouth daily. 06/05/16   Rolland Porter, MD  naproxen (NAPROSYN) 500 MG tablet Take 1 tablet (500 mg total) by mouth 2 (two) times daily. Limit use to 3-5 days and do not take in combination with celebrex 06/15/19   Horton, Mayer Masker, MD  potassium chloride (K-DUR,KLOR-CON) 10 MEQ tablet Take 10 mEq by mouth 2 (two) times daily.    [provider]  predniSONE (STERAPRED UNI-PAK 21 TAB) 10 MG (21) TBPK tablet Take 6 tabs for 2 days, then 5 for 2 days, then 4 for 2 days, then 3 for 2 days, 2 for 2 days, then 1 for 2 days 06/03/19   Jacalyn Lefevre, MD  rivaroxaban (XARELTO) 10 MG TABS tablet Take 10 mg by mouth daily.  [provider]    Allergies    Patient has no known allergies.  Review of Systems   Review of Systems  Constitutional: Negative for chills and fever.  HENT: Negative for congestion, rhinorrhea and sore throat.   Eyes: Negative for visual disturbance.  Respiratory: Negative for cough and shortness of breath.   Cardiovascular: Negative for chest pain and leg swelling.  Gastrointestinal: Negative for abdominal pain, diarrhea, nausea and vomiting.  Genitourinary: Negative for dysuria.  Musculoskeletal: Negative for back pain and neck pain.  Skin: Positive for wound. Negative for rash.  Neurological: Negative for dizziness, light-headedness and headaches.  Hematological: Does not bruise/bleed easily.  Psychiatric/Behavioral: Negative for confusion.    Physical Exam Updated Vital  Signs BP 131/84   Pulse 86   Temp 98.5 F (36.9 C) (Oral)   Resp 18   SpO2 99%   Physical Exam Vitals and nursing note reviewed.  Constitutional:      Appearance: Normal appearance. He is well-developed.  HENT:     Head: Normocephalic and atraumatic.  Eyes:     Extraocular Movements: Extraocular movements intact.     Conjunctiva/sclera: Conjunctivae normal.     Pupils: Pupils are equal, round, and reactive to light.  Cardiovascular:     Rate and Rhythm: Normal rate and regular rhythm.     Heart sounds: No murmur.  Pulmonary:     Effort: Pulmonary effort is normal. No respiratory distress.     Breath sounds: Normal breath sounds.  Abdominal:     Palpations: Abdomen is soft.     Tenderness: There is no abdominal tenderness.  Musculoskeletal:        Hawkins: Normal range of motion.     Cervical back: Normal range of motion and neck supple.     Comments: Pain with range of motion of the left wrist.  No snuffbox tenderness.  Neurovascularly intact.  Good strength with the fingers and thumb.  No erythema.  Skin:    Hawkins: Skin is warm and dry.     Capillary Refill: Capillary refill takes less than 2 seconds.     Comments: Multiple skin cyst.  Those on the superior anterior aspect of the right knee with a little bit of abscess formation but no deep abscess.  Probably about 2 or 3 skin cyst in that area.  Neurological:     Hawkins: No focal deficit present.     Mental Status: He is alert and oriented to person, place, and time.     Cranial Nerves: No cranial nerve deficit.     ED Results / Procedures / Treatments   Labs (all labs ordered are listed, but only abnormal results are displayed) Labs Reviewed - No data to display  EKG None  Radiology DG Wrist Complete Left  Result Date: 10/27/2019 CLINICAL DATA:  58 year old male with fall and trauma to the left wrist. EXAM: LEFT WRIST - COMPLETE 3+ VIEW COMPARISON:  Left hand radiograph dated 09/26/2019. FINDINGS: There is  no evidence of fracture or dislocation. There is no evidence of arthropathy or other focal bone abnormality. Soft tissues are unremarkable. IMPRESSION: Negative. Electronically Signed   By: Elgie CollardArash  Radparvar M.D.   On: 10/27/2019 18:34    Procedures Procedures (including critical care time)  Medications Ordered in ED Medications - No data to display  ED Course  I have reviewed the triage vital signs and the nursing notes.  Pertinent labs & imaging results that were available during my care of the patient were  reviewed by me and considered in my medical decision making (see chart for details).    MDM Rules/Calculators/A&P                        Patient left wrist pain persistent we will have him follow-up with sports medicine.  Repeat x-rays without any bony abnormalities.  Patient was some multiple skin cyst more acute ones on the superior aspect of the right knee.  No deep abscess.  No evidence of any knee effusion or knee swelling.  Will treat with doxycycline.    Final Clinical Impression(s) / ED Diagnoses Final diagnoses:  Left wrist pain  Skin cyst    Rx / DC Orders ED Discharge Orders         Ordered    doxycycline (VIBRAMYCIN) 100 MG capsule  2 times daily     10/27/19 Richarda Overlie, MD 10/27/19 1935

## 2019-10-27 NOTE — ED Notes (Signed)
Also has  Spots on both legs that he c=scratches now he states they are  Infected

## 2019-10-27 NOTE — Discharge Instructions (Addendum)
Follow-up with sports medicine: Make an appointment for the persistent left wrist discomfort.  Take the doxycycline for the skin cyst.  It will be a 7-day course of the antibiotic.  Return for any new or worse symptoms.

## 2020-03-25 ENCOUNTER — Ambulatory Visit: Payer: Medicaid Other | Admitting: Family Medicine

## 2020-03-25 NOTE — Progress Notes (Deleted)
  Tanner Hawkins - 59 y.o. male MRN [id number]  Date of birth: April 26, 1961  SUBJECTIVE:  Including CC & ROS.  No chief complaint on file.   Tanner Hawkins is a 59 y.o. male that is  ***.  ***   Review of Systems See HPI   HISTORY: Past Medical, Surgical, Social, and Family History Reviewed & Updated per EMR.   Pertinent Historical Findings include:  Past Medical History:  Diagnosis Date  . Atrial fibrillation (HCC)   . COPD (chronic obstructive pulmonary disease) (HCC)     Past Surgical History:  Procedure Laterality Date  . LEG SURGERY Right     No family history on file.  Social History   Socioeconomic History  . Marital status: Widowed    Spouse name: Not on file  . Number of children: Not on file  . Years of education: Not on file  . Highest education level: Not on file  Occupational History  . Not on file  Tobacco Use  . Smoking status: Current Every Day Smoker    Packs/day: 1.00    Types: Cigarettes  . Smokeless tobacco: Never Used  Substance and Sexual Activity  . Alcohol use: Never  . Drug use: Never  . Sexual activity: Not on file  Other Topics Concern  . Not on file  Social History Narrative  . Not on file   Social Determinants of Health   Financial Resource Strain:   . Difficulty of Paying Living Expenses:   Food Insecurity:   . Worried About Programme researcher, broadcasting/film/video in the Last Year:   . Barista in the Last Year:   Transportation Needs:   . Freight forwarder (Medical):   [name] Lack of Transportation (Non-Medical):   Physical Activity:   . Days of Exercise per Week:   . Minutes of Exercise per Session:   Stress:   . Feeling of Stress :   Social Connections:   . Frequency of Communication with Friends and Family:   . Frequency of Social Gatherings with Friends and Family:   . Attends Religious Services:   . Active Member of Clubs or Organizations:   . Attends Banker Meetings:   [name] Marital Status:   Intimate Partner  Violence:   . Fear of Current or Ex-Partner:   . Emotionally Abused:   [name] Physically Abused:   . Sexually Abused:      PHYSICAL EXAM:  VS: There were no vitals taken for this visit. Physical Exam Gen: NAD, alert, cooperative with exam, well-appearing MSK:  ***      ASSESSMENT & PLAN:   No problem-specific Assessment & Plan notes found for this encounter.

## 2020-04-04 ENCOUNTER — Emergency Department (HOSPITAL_BASED_OUTPATIENT_CLINIC_OR_DEPARTMENT_OTHER)
Admission: EM | Admit: 2020-04-04 | Discharge: 2020-04-04 | Disposition: A | Discharge status: Home / Self Care | Payer: Medicaid Other | Attending: Emergency Medicine | Admitting: Emergency Medicine

## 2020-04-04 ENCOUNTER — Other Ambulatory Visit: Payer: Self-pay

## 2020-04-04 ENCOUNTER — Encounter (HOSPITAL_BASED_OUTPATIENT_CLINIC_OR_DEPARTMENT_OTHER): Payer: Self-pay | Admitting: *Deleted

## 2020-04-04 DIAGNOSIS — J449 Chronic obstructive pulmonary disease, unspecified: Secondary | ICD-10-CM | POA: Insufficient documentation

## 2020-04-04 DIAGNOSIS — M25561 Pain in right knee: Secondary | ICD-10-CM | POA: Diagnosis present

## 2020-04-04 DIAGNOSIS — L089 Local infection of the skin and subcutaneous tissue, unspecified: Secondary | ICD-10-CM

## 2020-04-04 DIAGNOSIS — F17210 Nicotine dependence, cigarettes, uncomplicated: Secondary | ICD-10-CM | POA: Insufficient documentation

## 2020-04-04 DIAGNOSIS — Z7901 Long term (current) use of anticoagulants: Secondary | ICD-10-CM | POA: Diagnosis not present

## 2020-04-04 DIAGNOSIS — I4891 Unspecified atrial fibrillation: Secondary | ICD-10-CM | POA: Diagnosis not present

## 2020-04-04 DIAGNOSIS — M25562 Pain in left knee: Secondary | ICD-10-CM | POA: Insufficient documentation

## 2020-04-04 MED ORDER — DOXYCYCLINE HYCLATE 100 MG PO TABS
100.0000 mg | ORAL_TABLET | Freq: Once | ORAL | Status: AC
  Administered 2020-04-04: 100 mg via ORAL
  Filled 2020-04-04: qty 1

## 2020-04-04 MED ORDER — DOXYCYCLINE HYCLATE 100 MG PO CAPS: 100.0000 mg | ORAL_CAPSULE | Freq: Two times a day (BID) | ORAL | 0 refills | Status: AC

## 2020-04-04 NOTE — ED Triage Notes (Signed)
Pt reports bilateral knee infections x 3 days. Hx of same.

## 2020-04-04 NOTE — ED Provider Notes (Signed)
[facility] EMERGENCY DEPARTMENT Provider Note   CSN: [id number] Arrival date & time: 04/04/20  1543     History Chief Complaint  Patient presents with  . Knee Pain    Tanner Hawkins is a 59 y.o. male with a history of A. fib, COPD, osteoarthritis of bilateral knees, presented to emergency department with infections of the knees.  Reports 2 days onset of redness and pain about the patella both knees.  He says he has had multiple "skin infections".  In the same location in the past.  He says he typically is started on antibiotics (multiple doxycyline prescriptions seen in our records) for the symptoms, the redness tends to go away.  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  He says there is no work around for this.  He denies any fevers or chills.  He is able to ambulate and bear weight.  He is able to fully extend his legs.  HPI     Past Medical History:  Diagnosis Date  . Atrial fibrillation (HCC)   . COPD (chronic obstructive pulmonary disease) [facility])     Patient Active Problem List   Diagnosis Date Noted  . OA (osteoarthritis) of knee 07/02/2019  . Right foot drop 07/02/2019    Past Surgical History:  Procedure Laterality Date  . LEG SURGERY Right        History reviewed. No pertinent family history.  Social History   Tobacco Use  . Smoking status: Current Every Day Smoker    Packs/day: 1.00    Types: Cigarettes  . Smokeless tobacco: Never Used  Substance Use Topics  . Alcohol use: Never  . Drug use: Never    Home Medications Prior to Admission medications   Medication Sig Start Date End Date Taking? Authorizing Provider  albuterol (PROVENTIL) (2.5 MG/3ML) 0.083% nebulizer solution Take 3 mLs (2.5 mg total) by nebulization every 6 (six) hours as needed for wheezing or shortness of breath. 10/25/18   Rolan Bucco, MD  celecoxib (CELEBREX) 200 MG capsule Take 1 capsule (200 mg total) by mouth 2 (two) times daily. 06/03/19    Jacalyn Lefevre, MD  cephALEXin (KEFLEX) 500 MG capsule Take 1 capsule (500 mg total) by mouth 4 (four) times daily. 06/15/19   Horton, Mayer Masker, MD  digoxin (LANOXIN) 0.25 MG tablet Take 0.25 mg by mouth daily.    [provider]  diltiazem (CARDIZEM) 60 MG tablet Take 1 tablet (60 mg total) by mouth 4 (four) times daily. 06/05/16   Rolland Porter, MD  doxycycline (VIBRAMYCIN) 100 MG capsule Take 1 capsule (100 mg total) by mouth 2 (two) times daily. 01/14/19   Molpus, John, MD  doxycycline (VIBRAMYCIN) 100 MG capsule Take 1 capsule (100 mg total) by mouth 2 (two) times daily. 10/27/19   Vanetta Mulders, MD  doxycycline (VIBRAMYCIN) 100 MG capsule Take 1 capsule (100 mg total) by mouth 2 (two) times daily for 7 days. 04/05/20 04/12/20  Terald Sleeper, MD  furosemide (LASIX) 20 MG tablet Take 1 tablet (20 mg total) by mouth daily. 06/05/16   Rolland Porter, MD  naproxen (NAPROSYN) 500 MG tablet Take 1 tablet (500 mg total) by mouth 2 (two) times daily. Limit use to 3-5 days and do not take in combination with celebrex 06/15/19   Horton, Mayer Masker, MD  potassium chloride (K-DUR,KLOR-CON) 10 MEQ tablet Take 10 mEq by mouth 2 (two) times daily.    [provider]  predniSONE Albesa Seen  UNI-PAK 21 TAB) 10 MG (21) TBPK tablet Take 6 tabs for 2 days, then 5 for 2 days, then 4 for 2 days, then 3 for 2 days, 2 for 2 days, then 1 for 2 days 06/03/19   Isla Pence, MD  rivaroxaban (XARELTO) 10 MG TABS tablet Take 10 mg by mouth daily.    [provider]    Allergies    Patient has no known allergies.  Review of Systems   Review of Systems  Constitutional: Negative for chills and fever.  Respiratory: Negative for cough and shortness of breath.   Cardiovascular: Negative for chest pain and palpitations.  Gastrointestinal: Negative for abdominal pain and vomiting.  Genitourinary: Negative for dysuria and hematuria.  Musculoskeletal: Positive for arthralgias and myalgias.  Skin:  Positive for rash and wound.  Neurological: Negative for syncope and light-headedness.  All other systems reviewed and are negative.   Physical Exam Updated Vital Signs BP (!) 131/59 (BP Location: Left Arm)   Pulse 91   Temp 98.4 F (36.9 C) (Oral)   Resp 18   Ht 6' (1.829 m)   Wt 91.6 kg   SpO2 100%   BMI 27.40 kg/m   Physical Exam Vitals and nursing note reviewed.  Constitutional:      Appearance: He is well-developed.  HENT:     Head: Normocephalic and atraumatic.  Eyes:     Conjunctiva/sclera: Conjunctivae normal.  Cardiovascular:     Rate and Rhythm: Normal rate and regular rhythm.  Pulmonary:     Effort: Pulmonary effort is normal. No respiratory distress.  Musculoskeletal:     Cervical back: Neck supple.     Comments: Full ROM at bilateral knees without significant tenderness No warmth or patellar effusion Redness with small pustule on right knee Suprapatellar soft tissue swelling left knee without induration or fluctuance  Skin:    General: Skin is warm and dry.  Neurological:     General: No focal deficit present.     Mental Status: He is alert and oriented to person, place, and time.  Psychiatric:        Mood and Affect: Mood normal.        Behavior: Behavior normal.     ED Results / Procedures / Treatments   Labs (all labs ordered are listed, but only abnormal results are displayed) Labs Reviewed - No data to display  EKG None  Radiology No results found.  Procedures Procedures (including critical care time)  Medications Ordered in ED Medications  doxycycline (VIBRA-TABS) tablet 100 mg (100 mg Oral Given 04/04/20 1642)    ED Course  I have reviewed the triage vital signs and the nursing notes.  Pertinent labs & imaging results that were available during my care of the patient were reviewed by me and considered in my medical decision making (see chart for details).  59 year old male present emergency department with recurrent skin  infections around the knee.  He is full range of motion at the joint with minimal tenderness with range of motion.  I am highly doubtful that he has a septic joint.  Suspect he is having recurrent skin infections.  These are located only in the knees and or else to suspect disseminated GC.  We will treat with doxycycline x 7 days.  He has been on this multiple times in the past.  Advised keeping off his knees for a few days, but he says this is impossible with his work.  He cannot take time off.  Final Clinical Impression(s) / ED Diagnoses Final diagnoses:  Acute pain of both knees  Skin infection    Rx / DC Orders ED Discharge Orders         Ordered    doxycycline (VIBRAMYCIN) 100 MG capsule  2 times daily     04/04/20 1640           Terald Sleeper, MD 04/04/20 1645

## 2020-07-01 ENCOUNTER — Encounter (HOSPITAL_BASED_OUTPATIENT_CLINIC_OR_DEPARTMENT_OTHER): Payer: Self-pay | Admitting: *Deleted

## 2020-07-01 ENCOUNTER — Other Ambulatory Visit: Payer: Self-pay

## 2020-07-01 ENCOUNTER — Emergency Department (HOSPITAL_BASED_OUTPATIENT_CLINIC_OR_DEPARTMENT_OTHER)
Admission: EM | Admit: 2020-07-01 | Discharge: 2020-07-01 | Disposition: A | Discharge status: Home / Self Care | Payer: Medicaid Other | Attending: Emergency Medicine | Admitting: Emergency Medicine

## 2020-07-01 DIAGNOSIS — J449 Chronic obstructive pulmonary disease, unspecified: Secondary | ICD-10-CM | POA: Diagnosis not present

## 2020-07-01 DIAGNOSIS — R2242 Localized swelling, mass and lump, left lower limb: Secondary | ICD-10-CM | POA: Diagnosis present

## 2020-07-01 DIAGNOSIS — L03116 Cellulitis of left lower limb: Secondary | ICD-10-CM | POA: Diagnosis not present

## 2020-07-01 DIAGNOSIS — F17210 Nicotine dependence, cigarettes, uncomplicated: Secondary | ICD-10-CM | POA: Insufficient documentation

## 2020-07-01 DIAGNOSIS — Z79899 Other long term (current) drug therapy: Secondary | ICD-10-CM | POA: Insufficient documentation

## 2020-07-01 MED ORDER — SULFAMETHOXAZOLE-TRIMETHOPRIM 800-160 MG PO TABS
1.0000 | ORAL_TABLET | Freq: Once | ORAL | Status: AC
  Administered 2020-07-01: 1 via ORAL
  Filled 2020-07-01: qty 1

## 2020-07-01 MED ORDER — SULFAMETHOXAZOLE-TRIMETHOPRIM 800-160 MG PO TABS: 1.0000 | ORAL_TABLET | Freq: Two times a day (BID) | ORAL | 0 refills | Status: AC

## 2020-07-01 NOTE — Discharge Instructions (Signed)
Take bactrim twice daily for a week   See your doctor   Return to ER if you have worse knee swelling or pain, fever, worse drainage

## 2020-07-01 NOTE — ED Provider Notes (Signed)
[facility] EMERGENCY DEPARTMENT Provider Note   CSN: [id number] Arrival date & time: 07/01/20  2044     History Chief Complaint  Patient presents with  . Abscess    Tanner Hawkins is a 59 y.o. male history of A. fib on Xarelto, here presenting with left knee cellulitis.  Patient states that this is a recurrent problem.  He was seen here multiple times in the past.  He denies any IV drug use.  He states that he works on his knees a lot.  Denies any fevers or chills.  He states that for the last 3 to 4 days, a became more swollen and is self draining.  He states that it usually resolves with antibiotics.  The history is provided by the patient.       Past Medical History:  Diagnosis Date  . Atrial fibrillation (HCC)   . COPD (chronic obstructive pulmonary disease) [facility])     Patient Active Problem List   Diagnosis Date Noted  . OA (osteoarthritis) of knee 07/02/2019  . Right foot drop 07/02/2019    Past Surgical History:  Procedure Laterality Date  . LEG SURGERY Right        No family history on file.  Social History   Tobacco Use  . Smoking status: Current Every Day Smoker    Packs/day: 1.00    Types: Cigarettes  . Smokeless tobacco: Never Used  Vaping Use  . Vaping Use: Never used  Substance Use Topics  . Alcohol use: Never  . Drug use: Never    Home Medications Prior to Admission medications   Medication Sig Start Date End Date Taking? Authorizing Provider  albuterol (PROVENTIL) (2.5 MG/3ML) 0.083% nebulizer solution Take 3 mLs (2.5 mg total) by nebulization every 6 (six) hours as needed for wheezing or shortness of breath. 10/25/18   Rolan Bucco, MD  celecoxib (CELEBREX) 200 MG capsule Take 1 capsule (200 mg total) by mouth 2 (two) times daily. 06/03/19   Jacalyn Lefevre, MD  cephALEXin (KEFLEX) 500 MG capsule Take 1 capsule (500 mg total) by mouth 4 (four) times daily. 06/15/19   Horton, Mayer Masker, MD  digoxin (LANOXIN) 0.25 MG tablet Take  0.25 mg by mouth daily.    [provider]  diltiazem (CARDIZEM) 60 MG tablet Take 1 tablet (60 mg total) by mouth 4 (four) times daily. 06/05/16   Rolland Porter, MD  doxycycline (VIBRAMYCIN) 100 MG capsule Take 1 capsule (100 mg total) by mouth 2 (two) times daily. 01/14/19   Molpus, John, MD  doxycycline (VIBRAMYCIN) 100 MG capsule Take 1 capsule (100 mg total) by mouth 2 (two) times daily. 10/27/19   Vanetta Mulders, MD  furosemide (LASIX) 20 MG tablet Take 1 tablet (20 mg total) by mouth daily. 06/05/16   Rolland Porter, MD  naproxen (NAPROSYN) 500 MG tablet Take 1 tablet (500 mg total) by mouth 2 (two) times daily. Limit use to 3-5 days and do not take in combination with celebrex 06/15/19   Horton, Mayer Masker, MD  potassium chloride (K-DUR,KLOR-CON) 10 MEQ tablet Take 10 mEq by mouth 2 (two) times daily.    [provider]  predniSONE (STERAPRED UNI-PAK 21 TAB) 10 MG (21) TBPK tablet Take 6 tabs for 2 days, then 5 for 2 days, then 4 for 2 days, then 3 for 2 days, 2 for 2 days, then 1 for 2 days 06/03/19   Jacalyn Lefevre, MD  rivaroxaban (XARELTO) 10 MG TABS tablet Take 10 mg by mouth  daily.    [provider]    Allergies    Patient has no known allergies.  Review of Systems   Review of Systems  Skin: Positive for color change.  All other systems reviewed and are negative.   Physical Exam Updated Vital Signs BP (!) 157/90   Pulse 94   Temp 97.8 F (36.6 C) (Oral)   Resp 18   Ht 5\' 11"  (1.803 m)   Wt 90.7 kg   SpO2 100%   BMI 27.89 kg/m   Physical Exam Vitals and nursing note reviewed.  HENT:     Head: Normocephalic.     Mouth/Throat:     Mouth: Mucous membranes are moist.  Eyes:     Pupils: Pupils are equal, round, and reactive to light.  Cardiovascular:     Rate and Rhythm: Normal rate.     Pulses: Normal pulses.  Pulmonary:     Effort: Pulmonary effort is normal.  Abdominal:     General: Abdomen is flat.  Musculoskeletal:        General:  Normal range of motion.     Cervical back: Normal range of motion.  Skin:    Comments: l knee with a pimple and surrounding cellulitis.  There is no obvious knee effusion on exam.  The cellulitis does not seem to involve the knee joint.   Neurological:     General: No focal deficit present.     Mental Status: He is alert.  Psychiatric:        Mood and Affect: Mood normal.        Behavior: Behavior normal.     ED Results / Procedures / Treatments   Labs (all labs ordered are listed, but only abnormal results are displayed) Labs Reviewed - No data to display  EKG None  Radiology No results found.  Procedures Procedures (including critical care time)  Medications Ordered in ED Medications  sulfamethoxazole-trimethoprim (BACTRIM DS) 800-160 MG per tablet 1 tablet (has no administration in time range)    ED Course  I have reviewed the triage vital signs and the nursing notes.  Pertinent labs & imaging results that were available during my care of the patient were reviewed by me and considered in my medical decision making (see chart for details).    MDM Rules/Calculators/A&P                          Tanner Hawkins is a 59 y.o. male here presenting with left knee cellulitis.  There is a small area of fluctuance that looks like a pimple but it is draining already I offered to open it up further but patient states that he just wants antibiotics.  Patient is afebrile and he does not appear to have any joint effusion on exam.  Plan to discharge home with Bactrim. Gave strict return precautions   Final Clinical Impression(s) / ED Diagnoses Final diagnoses:  None    Rx / DC Orders ED Discharge Orders    None       77, MD 07/01/20 2212

## 2020-07-01 NOTE — ED Triage Notes (Signed)
C/o abscess to left knee x 3 days

## 2021-02-21 ENCOUNTER — Emergency Department (HOSPITAL_BASED_OUTPATIENT_CLINIC_OR_DEPARTMENT_OTHER)
Admission: EM | Admit: 2021-02-21 | Discharge: 2021-02-21 | Disposition: A | Discharge status: Home / Self Care | Payer: Medicaid Other | Attending: Emergency Medicine | Admitting: Emergency Medicine

## 2021-02-21 ENCOUNTER — Encounter (HOSPITAL_BASED_OUTPATIENT_CLINIC_OR_DEPARTMENT_OTHER): Payer: Self-pay | Admitting: *Deleted

## 2021-02-21 ENCOUNTER — Other Ambulatory Visit: Payer: Self-pay

## 2021-02-21 ENCOUNTER — Other Ambulatory Visit (HOSPITAL_BASED_OUTPATIENT_CLINIC_OR_DEPARTMENT_OTHER): Payer: Self-pay

## 2021-02-21 DIAGNOSIS — F17210 Nicotine dependence, cigarettes, uncomplicated: Secondary | ICD-10-CM | POA: Diagnosis not present

## 2021-02-21 DIAGNOSIS — J449 Chronic obstructive pulmonary disease, unspecified: Secondary | ICD-10-CM | POA: Diagnosis not present

## 2021-02-21 DIAGNOSIS — R2241 Localized swelling, mass and lump, right lower limb: Secondary | ICD-10-CM | POA: Diagnosis present

## 2021-02-21 DIAGNOSIS — L03115 Cellulitis of right lower limb: Secondary | ICD-10-CM | POA: Diagnosis not present

## 2021-02-21 MED ORDER — ONDANSETRON 4 MG PO TBDP
4.0000 mg | ORAL_TABLET | Freq: Three times a day (TID) | ORAL | 0 refills | Status: AC | PRN
  Filled 2021-02-21: qty 20, 7d supply, fill #0

## 2021-02-21 MED ORDER — SULFAMETHOXAZOLE-TRIMETHOPRIM 800-160 MG PO TABS
1.0000 | ORAL_TABLET | Freq: Two times a day (BID) | ORAL | 0 refills | Status: AC
  Filled 2021-02-21: qty 14, 7d supply, fill #0

## 2021-02-21 NOTE — ED Triage Notes (Signed)
Abscess to his right knee.

## 2021-02-21 NOTE — Discharge Instructions (Signed)
  Cleaning: Clean the wound and surrounding area gently with tap water and mild soap. Rinse well and blot dry. You may shower normally. Soaking the wound in Epsom salt baths for no more than 15 minutes once a day may help rinse out any remaining pus and help with wound healing.  Clean the wound daily to prevent further infection. Do not use cleaners such as hydrogen peroxide or alcohol.   Scar reduction: Application of a topical antibiotic ointment, such as Neosporin, after the wound has begun to close and heal well can decrease scab formation and reduce scarring. After the wound has healed, application of ointments such as Aquaphor can also reduce scar formation.  The key to scar reduction is keeping the skin well hydrated and supple. Drinking plenty of water throughout the day (At least eight 8oz glasses of water a day) is essential to staying well hydrated.  Sun exposure: Keep the wound out of the sun. After the wound has healed, continue to protect it from the sun by wearing protective clothing or applying sunscreen.  Pain: You may use Tylenol for pain. Please take all of your antibiotics until finished!   You may develop abdominal discomfort or diarrhea from the antibiotic.  You may help offset this with probiotics which you can buy or get in yogurt. Do not eat or take the probiotics until 2 hours after your antibiotic.   Prevention: There are some people who have a predisposition to abscess formation, however, there are some things that can be done to prevent abscesses in many people.  Most abscesses form because bacteria that naturally lives on the skin gets trapped underneath the skin.  This can occur through openings too small to see. Before and after any area of skin is shaved, wax, or abraded in any manner, the area should be washed with soap and water and rinsed well.   If you are having trouble with recurrent abscesses, it may be wise to perform a chlorhexidine wash regimen.  For 1 week,  wash all of your body with chlorhexidine (available over-the-counter at most pharmacies). You may also need to reevaluate your use of daily soap as soaps with perfumes or dyes can increase the chances of infection in some people.  Follow up: Please return to the ED or go to your primary care provider in 2-3 days for a wound check to assure proper healing.  Return: Return to the ED sooner should signs of worsening infection arise, such as spreading redness, worsening puffiness/swelling, severe increase in pain, fever over 100.11F, or any other major issues.  For prescription assistance, may try using prescription discount sites or apps, such as goodrx.com

## 2021-02-21 NOTE — ED Notes (Signed)
Pt. R knee noted with redness and wound the has scabbing and no drainage per Pt.  Pt. Stated the knee has been like this for a couple of days and he has not been bitten by a bug.  Pt. 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

## 2021-02-21 NOTE — ED Provider Notes (Signed)
[facility] EMERGENCY DEPARTMENT Provider Note   CSN: [id number] Arrival date & time: 02/21/21  1328     History Chief Complaint  Patient presents with  . Abscess    Tanner Hawkins is a 60 y.o. male.  HPI      Tanner Hawkins is a 60 y.o. male, with a history of A. fib, COPD, presenting to the ED with concern for infection to the skin of the right knee. He states he scraped his right knee 2 days ago and since then he has had onset of swelling and drainage to an area of a skin, now with surrounding redness. He has had previous instances of similar presentation, treated with antibiotics.  Denies fever, joint pain/swelling, shortness of breath, chest pain, recent procedures, or any other complaints.     Past Medical History:  Diagnosis Date  . Atrial fibrillation (HCC)   . COPD (chronic obstructive pulmonary disease) [facility])     Patient Active Problem List   Diagnosis Date Noted  . OA (osteoarthritis) of knee 07/02/2019  . Right foot drop 07/02/2019    Past Surgical History:  Procedure Laterality Date  . LEG SURGERY Right        No family history on file.  Social History   Tobacco Use  . Smoking status: Current Every Day Smoker    Packs/day: 1.00    Types: Cigarettes  . Smokeless tobacco: Never Used  Vaping Use  . Vaping Use: Never used  Substance Use Topics  . Alcohol use: Never  . Drug use: Never    Home Medications Prior to Admission medications   Medication Sig Start Date End Date Taking? Authorizing Provider  albuterol (PROVENTIL) (2.5 MG/3ML) 0.083% nebulizer solution Take 3 mLs (2.5 mg total) by nebulization every 6 (six) hours as needed for wheezing or shortness of breath. 10/25/18  Yes Rolan Bucco, MD  digoxin (LANOXIN) 0.25 MG tablet Take 0.25 mg by mouth daily.   Yes [provider]  diltiazem (CARDIZEM) 60 MG tablet Take 1 tablet (60 mg total) by mouth 4 (four) times daily. 06/05/16  Yes Rolland Porter, MD  furosemide (LASIX)  20 MG tablet Take 1 tablet (20 mg total) by mouth daily. 06/05/16  Yes Rolland Porter, MD  metFORMIN (GLUCOPHAGE) 500 MG tablet Take by mouth 2 (two) times daily with a meal.   Yes [provider]  ondansetron (ZOFRAN ODT) 4 MG disintegrating tablet Take 1 tablet (4 mg total) by mouth every 8 (eight) hours as needed for nausea or vomiting. 02/21/21  Yes Mardelle Pandolfi C, PA-C  potassium chloride (K-DUR,KLOR-CON) 10 MEQ tablet Take 10 mEq by mouth 2 (two) times daily.   Yes [provider]  rivaroxaban (XARELTO) 10 MG TABS tablet Take 10 mg by mouth daily.   Yes [provider]  sulfamethoxazole-trimethoprim (BACTRIM DS) 800-160 MG tablet Take 1 tablet by mouth 2 (two) times daily for 7 days. 02/21/21 02/28/21 Yes Keval Nam C, PA-C  celecoxib (CELEBREX) 200 MG capsule Take 1 capsule (200 mg total) by mouth 2 (two) times daily. 06/03/19   Jacalyn Lefevre, MD  naproxen (NAPROSYN) 500 MG tablet Take 1 tablet (500 mg total) by mouth 2 (two) times daily. Limit use to 3-5 days and do not take in combination with celebrex 06/15/19   Horton, Mayer Masker, MD  predniSONE (STERAPRED UNI-PAK 21 TAB) 10 MG (21) TBPK tablet Take 6 tabs for 2 days, then 5 for 2 days, then 4 for 2 days, then 3 for 2  days, 2 for 2 days, then 1 for 2 days 06/03/19   Jacalyn Lefevre, MD    Allergies    Patient has no known allergies.  Review of Systems   Review of Systems  Constitutional: Negative for fever.  Respiratory: Negative for shortness of breath.   Cardiovascular: Negative for chest pain.  Musculoskeletal: Negative for arthralgias.  Skin: Positive for color change.  Neurological: Negative for weakness.    Physical Exam Updated Vital Signs BP (!) 130/98 (BP Location: Right Arm)   Pulse 84   Temp 97.8 F (36.6 C) (Oral)   Resp 18   Ht 5\' 11"  (1.803 m)   Wt 93.2 kg   SpO2 99%   BMI 28.65 kg/m   Physical Exam Vitals and nursing note reviewed.  Constitutional:      General: He is not in acute  distress.    Appearance: He is well-developed. He is not diaphoretic.  HENT:     Head: Normocephalic and atraumatic.  Eyes:     Conjunctiva/sclera: Conjunctivae normal.  Cardiovascular:     Rate and Rhythm: Normal rate and regular rhythm.     Pulses:          Posterior tibial pulses are 2+ on the right side and 2+ on the left side.  Pulmonary:     Effort: Pulmonary effort is normal.  Musculoskeletal:     Cervical back: Neck supple.     Comments: Approximately 1 cm fluctuant mass to the right medial knee with evidence of recent drainage, but no active drainage.  About 1 cm of surrounding erythema.  No tenderness, swelling, erythema outside of this region. He has no noted swelling to the knee no tenderness to the knee joint. Full range of motion in the knee without pain, hesitation, or difficulty.   Skin:    General: Skin is warm and dry.     Coloration: Skin is not pale.  Neurological:     Mental Status: He is alert.     Comments: Sensation light touch grossly intact in the right lower extremity. Strength 5/5 in the right knee and ankle. Ambulatory without need for assistance and without gait deficit.  Psychiatric:        Behavior: Behavior normal.     ED Results / Procedures / Treatments   Labs (all labs ordered are listed, but only abnormal results are displayed) Labs Reviewed - No data to display  EKG None  Radiology No results found.  Procedures Ultrasound ED Soft Tissue  Date/Time: 02/21/2021 2:55 PM Performed by: 02/23/2021, PA-C Authorized by: Anselm Pancoast, PA-C   Procedure details:    Indications: localization of abscess and evaluate for cellulitis     Transverse view:  Visualized   Longitudinal view:  Visualized   Images: archived   Location:    Location: lower extremity   Findings:     abscess present    cellulitis present     Medications Ordered in ED Medications - No data to display  ED Course  I have reviewed the triage vital signs and  the nursing notes.  Pertinent labs & imaging results that were available during my care of the patient were reviewed by me and considered in my medical decision making (see chart for details).    MDM Rules/Calculators/A&P                          Patient presents with an area of pain and  swelling to the skin overlying the right knee. Low suspicion for sepsis or septic arthritis.  He has had this similar presentation before. I noted a small fluid collection on bedside ultrasound, which I suspected was a subcutaneous abscess.  I recommended I&D to the patient and explained my reasoning.  Patient declined this procedure.  We will initiate antibiotics.  He acknowledges there is a higher likelihood of recurrence or progression if I&D is not performed. The patient was given instructions for home care as well as return precautions. Patient voices understanding of these instructions, accepts the plan, and is comfortable with discharge.     Final Clinical Impression(s) / ED Diagnoses Final diagnoses:  Cellulitis of right lower extremity    Rx / DC Orders ED Discharge Orders         Ordered    sulfamethoxazole-trimethoprim (BACTRIM DS) 800-160 MG tablet  2 times daily        02/21/21 1509    ondansetron (ZOFRAN ODT) 4 MG disintegrating tablet  Every 8 hours PRN        02/21/21 1513           Anselm Pancoast, PA-C 02/21/21 1530    Koleen Distance, MD 02/22/21 [phone]

## 2021-04-26 ENCOUNTER — Emergency Department (HOSPITAL_BASED_OUTPATIENT_CLINIC_OR_DEPARTMENT_OTHER): Payer: Medicaid Other

## 2021-04-26 ENCOUNTER — Emergency Department (HOSPITAL_BASED_OUTPATIENT_CLINIC_OR_DEPARTMENT_OTHER)
Admission: EM | Admit: 2021-04-26 | Discharge: 2021-04-26 | Disposition: A | Discharge status: Home / Self Care | Payer: Medicaid Other | Attending: Emergency Medicine | Admitting: Emergency Medicine

## 2021-04-26 ENCOUNTER — Encounter (HOSPITAL_BASED_OUTPATIENT_CLINIC_OR_DEPARTMENT_OTHER): Payer: Self-pay | Admitting: *Deleted

## 2021-04-26 ENCOUNTER — Other Ambulatory Visit: Payer: Self-pay

## 2021-04-26 DIAGNOSIS — M7989 Other specified soft tissue disorders: Secondary | ICD-10-CM | POA: Diagnosis present

## 2021-04-26 DIAGNOSIS — F17210 Nicotine dependence, cigarettes, uncomplicated: Secondary | ICD-10-CM | POA: Diagnosis not present

## 2021-04-26 DIAGNOSIS — Y9389 Activity, other specified: Secondary | ICD-10-CM | POA: Insufficient documentation

## 2021-04-26 DIAGNOSIS — M7021 Olecranon bursitis, right elbow: Secondary | ICD-10-CM | POA: Diagnosis not present

## 2021-04-26 DIAGNOSIS — J449 Chronic obstructive pulmonary disease, unspecified: Secondary | ICD-10-CM | POA: Insufficient documentation

## 2021-04-26 DIAGNOSIS — Z7901 Long term (current) use of anticoagulants: Secondary | ICD-10-CM | POA: Diagnosis not present

## 2021-04-26 DIAGNOSIS — I4891 Unspecified atrial fibrillation: Secondary | ICD-10-CM | POA: Diagnosis not present

## 2021-04-26 NOTE — Discharge Instructions (Addendum)
Strongly recommend for at least the next week resting and not putting any weight or pressure on your elbows.  Recommend Tylenol as needed for pain.  Recommend ice and elevation.  Recommend following up with Dr. Jordan Likes.  If you have worsening swelling, redness despite these measures, come back to ER for reassessment.  If you have worsening redness and/or swelling, you may need antibiotics or drainage.  Whenever you resume using your arms as normal, strongly recommend using protective elbow pads.

## 2021-04-26 NOTE — ED Provider Notes (Signed)
[facility] EMERGENCY DEPARTMENT Provider Note   CSN: [id number] Arrival date & time: 04/26/21  1719     History Chief Complaint  Patient presents with   Arm Swelling    Tanner Hawkins is a 60 y.o. male.  Presents to ER with concern for elbow swelling.  Patient reports that since yesterday he has noted some swelling in his right elbow.  States that he has tenderness whenever his elbow is touched.  Does not have any change in range of motion.  No pain at present.  No fevers or chills.  Has had issues with arthritis in her knees and foot drop and has been seen by Dr. Jordan Likes previously with sports medicine.  Works as an Counsellor.  States that he is frequently on his knees and elbows.  HPI     Past Medical History:  Diagnosis Date   Atrial fibrillation (HCC)    COPD (chronic obstructive pulmonary disease) (HCC)     Patient Active Problem List   Diagnosis Date Noted   OA (osteoarthritis) of knee 07/02/2019   Right foot drop 07/02/2019    Past Surgical History:  Procedure Laterality Date   LEG SURGERY Right        History reviewed. No pertinent family history.  Social History   Tobacco Use   Smoking status: Every Day    Packs/day: 1.00    Pack years: 0.00    Types: Cigarettes   Smokeless tobacco: Never  Vaping Use   Vaping Use: Never used  Substance Use Topics   Alcohol use: Never   Drug use: Never    Home Medications Prior to Admission medications   Medication Sig Start Date End Date Taking? Authorizing Provider  albuterol (PROVENTIL) (2.5 MG/3ML) 0.083% nebulizer solution Take 3 mLs (2.5 mg total) by nebulization every 6 (six) hours as needed for wheezing or shortness of breath. 10/25/18   Rolan Bucco, MD  celecoxib (CELEBREX) 200 MG capsule Take 1 capsule (200 mg total) by mouth 2 (two) times daily. 06/03/19   Jacalyn Lefevre, MD  digoxin (LANOXIN) 0.25 MG tablet Take 0.25 mg by mouth daily.    [provider]  diltiazem  (CARDIZEM) 60 MG tablet Take 1 tablet (60 mg total) by mouth 4 (four) times daily. 06/05/16   Rolland Porter, MD  furosemide (LASIX) 20 MG tablet Take 1 tablet (20 mg total) by mouth daily. 06/05/16   Rolland Porter, MD  metFORMIN (GLUCOPHAGE) 500 MG tablet Take by mouth 2 (two) times daily with a meal.    [provider]  naproxen (NAPROSYN) 500 MG tablet Take 1 tablet (500 mg total) by mouth 2 (two) times daily. Limit use to 3-5 days and do not take in combination with celebrex 06/15/19   Horton, Mayer Masker, MD  ondansetron (ZOFRAN ODT) 4 MG disintegrating tablet Take 1 tablet (4 mg total) by mouth every 8 (eight) hours as needed for nausea or vomiting. 02/21/21   Joy, Shawn C, PA-C  potassium chloride (K-DUR,KLOR-CON) 10 MEQ tablet Take 10 mEq by mouth 2 (two) times daily.    [provider]  predniSONE (STERAPRED UNI-PAK 21 TAB) 10 MG (21) TBPK tablet Take 6 tabs for 2 days, then 5 for 2 days, then 4 for 2 days, then 3 for 2 days, 2 for 2 days, then 1 for 2 days 06/03/19   Jacalyn Lefevre, MD  rivaroxaban (XARELTO) 10 MG TABS tablet Take 10 mg by mouth daily.    [provider]  Allergies    Patient has no known allergies.  Review of Systems   Review of Systems  Musculoskeletal:  Positive for arthralgias.  All other systems reviewed and are negative.  Physical Exam Updated Vital Signs BP (!) 148/84 (BP Location: Left Arm)   Pulse 96   Temp 98.5 F (36.9 C) (Oral)   Resp 20   Ht 5\' 11"  (1.803 m)   Wt 91.6 kg   SpO2 98%   BMI 28.17 kg/m   Physical Exam Vitals and nursing note reviewed.  Constitutional:      Appearance: He is well-developed.  HENT:     Head: Normocephalic and atraumatic.  Eyes:     Conjunctiva/sclera: Conjunctivae normal.  Cardiovascular:     Rate and Rhythm: Normal rate and regular rhythm.     Heart sounds: No murmur heard. Pulmonary:     Effort: Pulmonary effort is normal. No respiratory distress.  Musculoskeletal:     Cervical back:  Neck supple.     Comments: Right upper extremity: There is mild swelling and tenderness over the olecranon process of the right elbow, normal joint ROM, no overlying erythema, normal radial pulse, normal sensation distally  Skin:    General: Skin is warm and dry.  Neurological:     General: No focal deficit present.     Mental Status: He is alert.  Psychiatric:        Mood and Affect: Mood normal.    ED Results / Procedures / Treatments   Labs (all labs ordered are listed, but only abnormal results are displayed) Labs Reviewed - No data to display  EKG None  Radiology DG Elbow Complete Right  Result Date: 04/26/2021 CLINICAL DATA:  Right elbow pain and swelling. Redness. Symptoms for 1 day. EXAM: RIGHT ELBOW - COMPLETE 3+ VIEW COMPARISON:  None. FINDINGS: There is no evidence of fracture, dislocation, or joint effusion. Well-defined calcification adjacent to the medial humeral epicondyle. There is no evidence of arthropathy or other focal bone abnormality. Prominent soft tissue prominence in the region of the olecranon bursa with regional soft tissue edema. IMPRESSION: 1. Prominent soft tissue prominence in the region of the olecranon bursa with regional soft tissue edema, suggesting olecranon bursitis. 2. No acute osseous abnormality or bone destruction. 3. Well-defined calcification adjacent to the medial humeral epicondyle, likely enthesopathic change. Electronically Signed   By: 04/28/2021 M.D.   On: 04/26/2021 17:46    Procedures Procedures   Medications Ordered in ED Medications - No data to display  ED Course  I have reviewed the triage vital signs and the nursing notes.  Pertinent labs & imaging results that were available during my care of the patient were reviewed by me and considered in my medical decision making (see chart for details).    MDM Rules/Calculators/A&P                          61 year old male presenting to ER with concern for right elbow pain.   X-ray negative for fracture/dislocation.  On exam he does have mild amount of swelling in the region of his olecranon bursa.  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  Suspect mild olecranon bursitis.  Given there is no significant overlying erythema, no fever, low suspicion for septic process at present.  On anticoagulation for A. fib therefore NSAIDs contraindicated.  Recommend trial of rest and follow-up with Ortho or sports medicine.  Discussed return precautions  in detail should conservative management not result in improvement.  Patient demonstrated good understanding was discharged home.  After the discussed management above, the patient was determined to be safe for discharge.  The patient was in agreement with this plan and all questions regarding their care were answered.  ED return precautions were discussed and the patient will return to the ED with any significant worsening of condition.  Final Clinical Impression(s) / ED Diagnoses Final diagnoses:  Olecranon bursitis of right elbow    Rx / DC Orders ED Discharge Orders     None        Milagros Loll, MD 04/26/21 [phone]

## 2021-04-26 NOTE — ED Triage Notes (Signed)
C/o right elbow swelling x 1 day , denies injury

## 2021-05-12 ENCOUNTER — Ambulatory Visit: Payer: Self-pay

## 2021-05-12 ENCOUNTER — Other Ambulatory Visit: Payer: Self-pay

## 2021-05-12 ENCOUNTER — Ambulatory Visit (INDEPENDENT_AMBULATORY_CARE_PROVIDER_SITE_OTHER): Payer: Medicaid Other | Admitting: Family Medicine

## 2021-05-12 ENCOUNTER — Encounter: Payer: Self-pay | Admitting: Family Medicine

## 2021-05-12 VITALS — BP 130/72 | Ht 71.0 in | Wt 202.0 lb

## 2021-05-12 DIAGNOSIS — M7021 Olecranon bursitis, right elbow: Secondary | ICD-10-CM

## 2021-05-12 DIAGNOSIS — M21371 Foot drop, right foot: Secondary | ICD-10-CM

## 2021-05-12 MED ORDER — PREDNISONE 5 MG PO TABS: 10.0000 mg | ORAL_TABLET | Freq: Every day | ORAL | 0 refills | Status: DC

## 2021-05-12 NOTE — Progress Notes (Signed)
  Tanner Hawkins - 60 y.o. male MRN [id number]  Date of birth: [birth year]  SUBJECTIVE:  Including CC & ROS.  No chief complaint on file.   Tanner Hawkins is a 60 y.o. male that is presenting with right elbow pain.  The pain has been ongoing for a few days.  Having some soreness and redness.  Denies any injury or inciting event.  Has a history of foot drop of the right foot.   Review of Systems See HPI   HISTORY: Past Medical, Surgical, Social, and Family History Reviewed & Updated per EMR.   Pertinent Historical Findings include:  Past Medical History:  Diagnosis Date   Atrial fibrillation (HCC)    COPD (chronic obstructive pulmonary disease) (HCC)     Past Surgical History:  Procedure Laterality Date   LEG SURGERY Right     History reviewed. No pertinent family history.  Social History   Socioeconomic History   Marital status: Widowed    Spouse name: Not on file   Number of children: Not on file   Years of education: Not on file   Highest education level: Not on file  Occupational History   Not on file  Tobacco Use   Smoking status: Every Day    Packs/day: 1.00    Pack years: 0.00    Types: Cigarettes   Smokeless tobacco: Never  Vaping Use   Vaping Use: Never used  Substance and Sexual Activity   Alcohol use: Never   Drug use: Never   Sexual activity: Not on file  Other Topics Concern   Not on file  Social History Narrative   Not on file   Social Determinants of Health   Financial Resource Strain: Not on file  Food Insecurity: Not on file  Transportation Needs: Not on file  Physical Activity: Not on file  Stress: Not on file  Social Connections: Not on file  Intimate Partner Violence: Not on file     PHYSICAL EXAM:  VS: BP 130/72 (BP Location: Left Arm, Patient Position: Sitting, Cuff Size: Normal)   Ht 5\' 11"  (1.803 m)   Wt 202 lb (91.6 kg)   BMI 28.17 kg/m  Physical Exam Gen: NAD, alert, cooperative with exam, well-appearing MSK:  Right  elbow: Redness and swelling over the olecranon. Normal range of motion. Normal strength resistance. Neurovascular intact  Limited ultrasound: Right elbow:  Pockets of fluid of the olecranon bursa.  Hyperemia associated with this area. Normal insertion of the triceps tendon. No elbow joint effusion. No cobblestoning.  Summary: Olecranon bursitis  Ultrasound and interpretation by , MD    ASSESSMENT & PLAN:   Olecranon bursitis of right elbow Having pockets of fluid as opposed to 1 large bursa.  Seems to have a component of inflammatory but seems less likely for infectious based on ultrasound findings. -Counseled on home exercise therapy and supportive care. -Provided and counseled on compression. -Prednisone and counseled on monitoring her sugars. -Could consider uric acid or further inflammatory work-up.  Right foot drop Acute on chronic in nature. -Provided cushioning for his brace. -Provided paperwork.

## 2021-05-12 NOTE — Patient Instructions (Signed)
Good to see you Please try compression  Please try ice  Please try the prednisone. Please monitor your blood sugar while taking it.   Please send me a message in MyChart with any questions or updates.  Please see me back in 2 weeks.   --Dr. Jordan Likes

## 2021-05-12 NOTE — Assessment & Plan Note (Signed)
Having pockets of fluid as opposed to 1 large bursa.  Seems to have a component of inflammatory but seems less likely for infectious based on ultrasound findings. -Counseled on home exercise therapy and supportive care. -Provided and counseled on compression. -Prednisone and counseled on monitoring her sugars. -Could consider uric acid or further inflammatory work-up.

## 2021-05-12 NOTE — Assessment & Plan Note (Signed)
Acute on chronic in nature. -Provided cushioning for his brace. -Provided paperwork.

## 2021-06-02 ENCOUNTER — Other Ambulatory Visit: Payer: Self-pay

## 2021-06-02 ENCOUNTER — Ambulatory Visit (INDEPENDENT_AMBULATORY_CARE_PROVIDER_SITE_OTHER): Payer: Medicaid Other | Admitting: Family Medicine

## 2021-06-02 VITALS — Ht 71.0 in | Wt 205.0 lb

## 2021-06-02 DIAGNOSIS — M7021 Olecranon bursitis, right elbow: Secondary | ICD-10-CM

## 2021-06-02 NOTE — Assessment & Plan Note (Signed)
Bursitis is still occurring but has gotten improvement.  Mild in nature.   - counseled on home exercise therapy and supportive care - dueixs samples  - elbow sleeve compression.

## 2021-06-02 NOTE — Progress Notes (Signed)
  Tanner Hawkins - 60 y.o. male MRN [id number]  Date of birth: 07-16-1961  SUBJECTIVE:  Including CC & ROS.  No chief complaint on file.   Tanner Hawkins is a 60 y.o. male that is following up for his right elbow pain.  Pain is still ongoing.  Has not gotten worse.  Still having some swelling of the bursa.   Review of Systems See HPI   HISTORY: Past Medical, Surgical, Social, and Family History Reviewed & Updated per EMR.   Pertinent Historical Findings include:  Past Medical History:  Diagnosis Date   Atrial fibrillation (HCC)    COPD (chronic obstructive pulmonary disease) (HCC)     Past Surgical History:  Procedure Laterality Date   LEG SURGERY Right     No family history on file.  Social History   Socioeconomic History   Marital status: Widowed    Spouse name: Not on file   Number of children: Not on file   Years of education: Not on file   Highest education level: Not on file  Occupational History   Not on file  Tobacco Use   Smoking status: Every Day    Packs/day: 1.00    Types: Cigarettes   Smokeless tobacco: Never  Vaping Use   Vaping Use: Never used  Substance and Sexual Activity   Alcohol use: Never   Drug use: Never   Sexual activity: Not on file  Other Topics Concern   Not on file  Social History Narrative   Not on file   Social Determinants of Health   Financial Resource Strain: Not on file  Food Insecurity: Not on file  Transportation Needs: Not on file  Physical Activity: Not on file  Stress: Not on file  Social Connections: Not on file  Intimate Partner Violence: Not on file     PHYSICAL EXAM:  VS: Ht 5\' 11"  (1.803 m)   Wt 205 lb (93 kg)   BMI 28.59 kg/m  Physical Exam Gen: NAD, alert, cooperative with exam, well-appearing       ASSESSMENT & PLAN:   Olecranon bursitis of right elbow Bursitis is still occurring but has gotten improvement.  Mild in nature.   - counseled on home exercise therapy and supportive care - dueixs  samples  - elbow sleeve compression.

## 2021-06-02 NOTE — Patient Instructions (Signed)
Good to see you Please try compression  Please try the duexis as needed   Please send me a message in MyChart with any questions or updates.  Please see [place] back as needed.   --Dr. Jordan Likes

## 2021-08-27 ENCOUNTER — Emergency Department (HOSPITAL_BASED_OUTPATIENT_CLINIC_OR_DEPARTMENT_OTHER): Payer: Medicaid Other

## 2021-08-27 ENCOUNTER — Other Ambulatory Visit: Payer: Self-pay

## 2021-08-27 DIAGNOSIS — Y9389 Activity, other specified: Secondary | ICD-10-CM | POA: Diagnosis not present

## 2021-08-27 DIAGNOSIS — X58XXXA Exposure to other specified factors, initial encounter: Secondary | ICD-10-CM | POA: Diagnosis not present

## 2021-08-27 DIAGNOSIS — S80211A Abrasion, right knee, initial encounter: Secondary | ICD-10-CM | POA: Insufficient documentation

## 2021-08-27 DIAGNOSIS — Z5321 Procedure and treatment not carried out due to patient leaving prior to being seen by health care provider: Secondary | ICD-10-CM | POA: Diagnosis not present

## 2021-08-27 DIAGNOSIS — S80911A Unspecified superficial injury of right knee, initial encounter: Secondary | ICD-10-CM | POA: Diagnosis present

## 2021-08-27 DIAGNOSIS — Y990 Civilian activity done for income or pay: Secondary | ICD-10-CM | POA: Insufficient documentation

## 2021-08-27 DIAGNOSIS — Y9289 Other specified places as the place of occurrence of the external cause: Secondary | ICD-10-CM | POA: Diagnosis not present

## 2021-08-27 LAB — COMPREHENSIVE METABOLIC PANEL
ALT: 12 U/L (ref 0–44)
AST: 15 U/L (ref 15–41)
Albumin: 4.1 g/dL (ref 3.5–5.0)
Alkaline Phosphatase: 71 U/L (ref 38–126)
Anion gap: 7 (ref 5–15)
BUN: 16 mg/dL (ref 8–23)
CO2: 28 mmol/L (ref 22–32)
Calcium: 9 mg/dL (ref 8.9–10.3)
Chloride: 102 mmol/L (ref 98–111)
Creatinine, Ser: 0.89 mg/dL (ref 0.61–1.24)
GFR, Estimated: >60 mL/min (ref >60)
Glucose, Bld: 105 mg/dL — ABNORMAL HIGH (ref 70–99)
Potassium: 4.2 mmol/L (ref 3.5–5.1)
Sodium: 137 mmol/L (ref 135–145)
Total Bilirubin: 0.3 mg/dL (ref 0.3–1.2)
Total Protein: 7.3 g/dL (ref 6.5–8.1)

## 2021-08-27 LAB — CBC
HCT: 45.2 % (ref 39.0–52.0)
Hemoglobin: 15.2 g/dL (ref 13.0–17.0)
MCH: 31.2 pg (ref 26.0–34.0)
MCHC: 33.6 g/dL (ref 30.0–36.0)
MCV: 92.8 fL (ref 80.0–100.0)
Platelets: 277 10*3/uL (ref 150–400)
RBC: 4.87 MIL/uL (ref 4.22–5.81)
RDW: 12.7 % (ref 11.5–15.5)
WBC: 10.6 10*3/uL — ABNORMAL HIGH (ref 4.0–10.5)
nRBC: 0 % (ref 0.0–0.2)

## 2021-08-27 NOTE — ED Triage Notes (Signed)
Pt came in with c/o R knee swelling and redness with abrasion. Pt states that he has had an infection in his knee prior. 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 Started three days ago

## 2021-08-28 ENCOUNTER — Emergency Department (HOSPITAL_BASED_OUTPATIENT_CLINIC_OR_DEPARTMENT_OTHER)
Admission: EM | Admit: 2021-08-28 | Discharge: 2021-08-28 | Disposition: A | Discharge status: Home / Self Care | Payer: Medicaid Other | Attending: Emergency Medicine | Admitting: Emergency Medicine

## 2021-10-12 ENCOUNTER — Other Ambulatory Visit: Payer: Self-pay

## 2021-10-12 ENCOUNTER — Emergency Department (HOSPITAL_BASED_OUTPATIENT_CLINIC_OR_DEPARTMENT_OTHER)
Admission: EM | Admit: 2021-10-12 | Discharge: 2021-10-12 | Disposition: A | Discharge status: Home / Self Care | Payer: Medicaid Other | Attending: Emergency Medicine | Admitting: Emergency Medicine

## 2021-10-12 ENCOUNTER — Encounter (HOSPITAL_BASED_OUTPATIENT_CLINIC_OR_DEPARTMENT_OTHER): Payer: Self-pay

## 2021-10-12 ENCOUNTER — Emergency Department (HOSPITAL_BASED_OUTPATIENT_CLINIC_OR_DEPARTMENT_OTHER): Payer: Medicaid Other

## 2021-10-12 DIAGNOSIS — R0602 Shortness of breath: Secondary | ICD-10-CM | POA: Diagnosis present

## 2021-10-12 DIAGNOSIS — J441 Chronic obstructive pulmonary disease with (acute) exacerbation: Secondary | ICD-10-CM | POA: Insufficient documentation

## 2021-10-12 DIAGNOSIS — Z7901 Long term (current) use of anticoagulants: Secondary | ICD-10-CM | POA: Insufficient documentation

## 2021-10-12 DIAGNOSIS — I4891 Unspecified atrial fibrillation: Secondary | ICD-10-CM | POA: Diagnosis not present

## 2021-10-12 DIAGNOSIS — F17210 Nicotine dependence, cigarettes, uncomplicated: Secondary | ICD-10-CM | POA: Insufficient documentation

## 2021-10-12 MED ORDER — ALBUTEROL SULFATE HFA 108 (90 BASE) MCG/ACT IN AERS: 2.0000 | INHALATION_SPRAY | RESPIRATORY_TRACT | Status: DC | PRN

## 2021-10-12 MED ORDER — PREDNISONE 50 MG PO TABS
60.0000 mg | ORAL_TABLET | Freq: Once | ORAL | Status: AC
  Administered 2021-10-12: 60 mg via ORAL
  Filled 2021-10-12: qty 1

## 2021-10-12 MED ORDER — IPRATROPIUM-ALBUTEROL 0.5-2.5 (3) MG/3ML IN SOLN
3.0000 mL | Freq: Once | RESPIRATORY_TRACT | Status: AC
  Administered 2021-10-12: 3 mL via RESPIRATORY_TRACT
  Filled 2021-10-12: qty 3

## 2021-10-12 MED ORDER — ALBUTEROL SULFATE (5 MG/ML) 0.5% IN NEBU: 2.5000 mg | INHALATION_SOLUTION | Freq: Four times a day (QID) | RESPIRATORY_TRACT | 12 refills | Status: AC | PRN

## 2021-10-12 MED ORDER — PREDNISONE 10 MG (21) PO TBPK: ORAL_TABLET | Freq: Every day | ORAL | 0 refills | Status: AC

## 2021-10-12 NOTE — ED Provider Notes (Signed)
[facility] EMERGENCY DEPARTMENT Provider Note   CSN: [id number] Arrival date & time: 10/12/21  1503     History Chief Complaint  Patient presents with   Shortness of Breath    Tanner Hawkins is a 60 y.o. male who recently recovered from influenza 1 week ago and presents today with 2 days of worsening shortness of breath primarily with exertion.  History of COPD for which she is not on any medication daily.  Continues to smoke 2 packs of cigarettes daily.  Denies any fevers, chest pain, or other infectious symptoms at this time.  I personally reviewed this patient's medical records.  He does have history of A. fib on Xarelto.  HPI     Past Medical History:  Diagnosis Date   Atrial fibrillation (HCC)    COPD (chronic obstructive pulmonary disease) (HCC)     Patient Active Problem List   Diagnosis Date Noted   Olecranon bursitis of right elbow 05/12/2021   OA (osteoarthritis) of knee 07/02/2019   Right foot drop 07/02/2019    Past Surgical History:  Procedure Laterality Date   LEG SURGERY Right        No family history on file.  Social History   Tobacco Use   Smoking status: Every Day    Packs/day: 1.00    Types: Cigarettes   Smokeless tobacco: Never  Vaping Use   Vaping Use: Never used  Substance Use Topics   Alcohol use: Never   Drug use: Never    Home Medications Prior to Admission medications   Medication Sig Start Date End Date Taking? Authorizing Provider  albuterol (PROVENTIL) (5 MG/ML) 0.5% nebulizer solution Take 0.5 mLs (2.5 mg total) by nebulization every 6 (six) hours as needed for wheezing or shortness of breath. 10/12/21  Yes Ayjah Show R, PA-C  predniSONE (STERAPRED UNI-PAK 21 TAB) 10 MG (21) TBPK tablet Take by mouth daily. Take 6 tabs by mouth daily  for 2 days, then 5 tabs for 2 days, then 4 tabs for 2 days, then 3 tabs for 2 days, 2 tabs for 2 days, then 1 tab by mouth daily for 2 days 10/12/21  Yes Anjelica Gorniak R,  PA-C  celecoxib (CELEBREX) 200 MG capsule Take 1 capsule (200 mg total) by mouth 2 (two) times daily. 06/03/19   Jacalyn Lefevre, MD  digoxin (LANOXIN) 0.25 MG tablet Take 0.25 mg by mouth daily.    [provider]  diltiazem (CARDIZEM) 60 MG tablet Take 1 tablet (60 mg total) by mouth 4 (four) times daily. 06/05/16   Rolland Porter, MD  furosemide (LASIX) 20 MG tablet Take 1 tablet (20 mg total) by mouth daily. 06/05/16   Rolland Porter, MD  metFORMIN (GLUCOPHAGE) 500 MG tablet Take by mouth 2 (two) times daily with a meal.    [provider]  naproxen (NAPROSYN) 500 MG tablet Take 1 tablet (500 mg total) by mouth 2 (two) times daily. Limit use to 3-5 days and do not take in combination with celebrex 06/15/19   Horton, Mayer Masker, MD  ondansetron (ZOFRAN ODT) 4 MG disintegrating tablet Take 1 tablet (4 mg total) by mouth every 8 (eight) hours as needed for nausea or vomiting. 02/21/21   Joy, Shawn C, PA-C  potassium chloride (K-DUR,KLOR-CON) 10 MEQ tablet Take 10 mEq by mouth 2 (two) times daily.    [provider]  rivaroxaban (XARELTO) 10 MG TABS tablet Take 10 mg by mouth daily.    [provider]  Allergies    Patient has no known allergies.  Review of Systems   Review of Systems  Constitutional: Negative.   HENT: Negative.    Eyes: Negative.   Respiratory:  Positive for cough, chest tightness and shortness of breath.   Cardiovascular: Negative.  Negative for chest pain and palpitations.  Gastrointestinal: Negative.   Genitourinary: Negative.   Musculoskeletal: Negative.   Neurological: Negative.   Hematological: Negative.    Physical Exam Updated Vital Signs BP 126/84 (BP Location: Right Arm)   Pulse 93   Temp 98.4 F (36.9 C) (Oral)   Resp 20   Ht 5\' 11"  (1.803 m)   Wt 88.5 kg   SpO2 95%   BMI 27.20 kg/m   Physical Exam Vitals and nursing note reviewed.  Constitutional:      General: He is not in acute distress.    Appearance: He is not  ill-appearing or toxic-appearing.  HENT:     Head: Normocephalic and atraumatic.     Nose: Nose normal.     Mouth/Throat:     Mouth: Mucous membranes are moist.     Pharynx: Oropharynx is clear. Uvula midline. No oropharyngeal exudate or posterior oropharyngeal erythema.     Tonsils: No tonsillar exudate.  Eyes:     General: Lids are normal. Vision grossly intact.        Right eye: No discharge.        Left eye: No discharge.     Extraocular Movements: Extraocular movements intact.     Conjunctiva/sclera: Conjunctivae normal.     Pupils: Pupils are equal, round, and reactive to light.  Neck:     Trachea: Trachea and phonation normal.  Cardiovascular:     Rate and Rhythm: Normal rate and regular rhythm.     Pulses: Normal pulses.     Heart sounds: Normal heart sounds. No murmur heard. Pulmonary:     Effort: Tachypnea, accessory muscle usage and prolonged expiration present. No retractions.     Breath sounds: Examination of the right-middle field reveals wheezing. Examination of the left-middle field reveals decreased breath sounds and wheezing. Examination of the right-lower field reveals decreased breath sounds and wheezing. Examination of the left-lower field reveals decreased breath sounds and wheezing. Decreased breath sounds and wheezing present. No rales.     Comments: Desaturated to the 80s on room air with ambulation.  Improved on 2 L O2, able to be weaned off when resting. Chest:     Chest wall: No mass, swelling, tenderness, crepitus or edema.  Abdominal:     General: Bowel sounds are normal. There is no distension.     Palpations: Abdomen is soft.     Tenderness: There is no abdominal tenderness. There is no right CVA tenderness, left CVA tenderness, guarding or rebound.  Musculoskeletal:        General: No deformity.     Cervical back: Normal range of motion and neck supple. No edema, rigidity or crepitus. No pain with movement, spinous process tenderness or muscular  tenderness.     Right lower leg: No edema.     Left lower leg: No edema.  Lymphadenopathy:     Cervical: No cervical adenopathy.  Skin:    General: Skin is warm and dry.     Capillary Refill: Capillary refill takes less than 2 seconds.     Coloration: Skin is not sallow.  Neurological:     General: No focal deficit present.     Mental Status: He is alert  and oriented to person, place, and time. Mental status is at baseline.     Gait: Gait is intact. Gait normal.  Psychiatric:        Mood and Affect: Mood normal.    ED Results / Procedures / Treatments   Labs (all labs ordered are listed, but only abnormal results are displayed) Labs Reviewed - No data to display  EKG None  Radiology DG Chest 2 View  Result Date: 10/12/2021 CLINICAL DATA:  Shortness of breath EXAM: CHEST - 2 VIEW COMPARISON:  2019 FINDINGS: The heart size and mediastinal contours are within normal limits. Both lungs are clear. No pleural effusion or pneumothorax. The visualized skeletal structures are unremarkable. IMPRESSION: No active cardiopulmonary disease. Electronically Signed   By: Macy Mis M.D.   On: 10/12/2021 15:52    Procedures Procedures   Medications Ordered in ED Medications  albuterol (VENTOLIN HFA) 108 (90 Base) MCG/ACT inhaler 2 puff (has no administration in time range)  ipratropium-albuterol (DUONEB) 0.5-2.5 (3) MG/3ML nebulizer solution 3 mL (3 mLs Nebulization Given 10/12/21 1729)  predniSONE (DELTASONE) tablet 60 mg (60 mg Oral Given 10/12/21 1727)    ED Course  I have reviewed the triage vital signs and the nursing notes.  Pertinent labs & imaging results that were available during my care of the patient were reviewed by me and considered in my medical decision making (see chart for details).    MDM Rules/Calculators/A&P                         60 year old male who presents with shortness of breath with exertion x2 days.  Differential includes but is not limited to COPD  exacerbation, CHF, ACS, PE, pleural effusion, pneumothorax, pneumonia.  Hypertensive on intake, intermittently tachypneic and hypoxic with ambulation.  Cardiac exam is unremarkable, pulmonary exam with decreased air movement throughout the lung fields bilaterally with inspiratory and equatorial wheezing.  Patient otherwise very well-appearing with unremarkable HEENT and abdominal exams.  Neurovascular intact in all 4 extremities.  Laboratory studies and IV access ordered but patient refused stating he does not want to undergo phlebotomy or IV placement.  Given he is overall well-appearing will administer oral steroid.  DuoNeb ordered.  Chest x-ray without acute cardiopulmonary disease.  EKG overall very reassuring.  Atrial fibrillation noted the patient without rapid ventricular response.  Patient reevaluated after administration of DuoNeb with complete resolution of his sensation of chest tightness.  He does have some residual wheezing, however he refused offer of second nebulized treatment while in the emergency department.  When ambulated he was able to maintain his oxygen saturations above 95% on room air without significant increase in his work of breathing.  No further work-up warranted in the ER at this time.  Suspect acute COPD exacerbation.  Will discharge with albuterol and nebulizer treatments and steroid taper.  Jozeph voiced understanding of his medical evaluation and treatment plan. Each of his questions was answered to his expressed satisfaction.  Return precautions were given.  Patient is well-appearing, stable, and appropriate for discharge at this time.  This chart was dictated using voice recognition software, Dragon. Despite the best efforts of this provider to proofread and correct errors, errors may still occur which can change documentation meaning.   Final Clinical Impression(s) / ED Diagnoses Final diagnoses:  COPD exacerbation ([place])    Rx / DC Orders ED Discharge Orders           Ordered  albuterol (PROVENTIL) (5 MG/ML) 0.5% nebulizer solution  Every 6 hours PRN        10/12/21 1914    predniSONE (STERAPRED UNI-PAK 21 TAB) 10 MG (21) TBPK tablet  Daily        10/12/21 1914    For home use only DME Nebulizer machine        10/12/21 [address] Sharlene Dory 10/12/21 2115    Tegeler, Gwenyth Allegra, MD 10/13/21 [phone]

## 2021-10-12 NOTE — ED Notes (Signed)
Called to Triage for patient with [facility]

## 2021-10-12 NOTE — Discharge Instructions (Addendum)
You were seen in the ER for your shortness of breath.  You having a COPD exacerbation.  This is been treated with a breathing treatment in the ER.  You have been prescribed a steroid to take at home for the next week.  Additionally you have albuterol prescribed to use in her nebulizer machine as needed.  May follow-up with your primary care doctor return to ER with any worsening difficulty breathing chest pain, palpitations, or any other new severe symptoms.

## 2021-10-12 NOTE — ED Notes (Signed)
Patient ambulated from lobby to xray, SpO2 92% HR 115, +DOE.

## 2021-10-12 NOTE — ED Triage Notes (Signed)
Pt c/o SOB "when I walk" x 2 days-denies pain-states he was dx with flu last week-neg covid-NAD-steady gait

## 2021-10-12 NOTE — ED Notes (Signed)
Patient SpO2 92% pl;aced on 2lpm [place], SpO2 100% now.

## 2021-10-12 NOTE — ED Notes (Addendum)
Ambulated from room 12 to restroom.  SpO2 94-95, HR 89-105, denies increased work of breathing.

## 2021-11-21 ENCOUNTER — Telehealth: Payer: Self-pay | Admitting: Family Medicine

## 2021-11-21 NOTE — Telephone Encounter (Signed)
Pt called states his Handicap Plaqard has expired & provider said to let him know  so a reissued application can be filled out for replacement.  --glh

## 2021-11-22 NOTE — Telephone Encounter (Signed)
Left detailed message informing pt he can pick up his completed handicap placard form today.

## 2022-02-03 ENCOUNTER — Other Ambulatory Visit: Payer: Self-pay | Admitting: Endocrinology

## 2022-07-04 IMAGING — CR DG CHEST 2V
3 series · 3 of 3 positions shown · non-contrast
Comparison: [id number]

CLINICAL DATA: Shortness of breath

EXAM:
CHEST - 2 VIEW

[w chest pa]
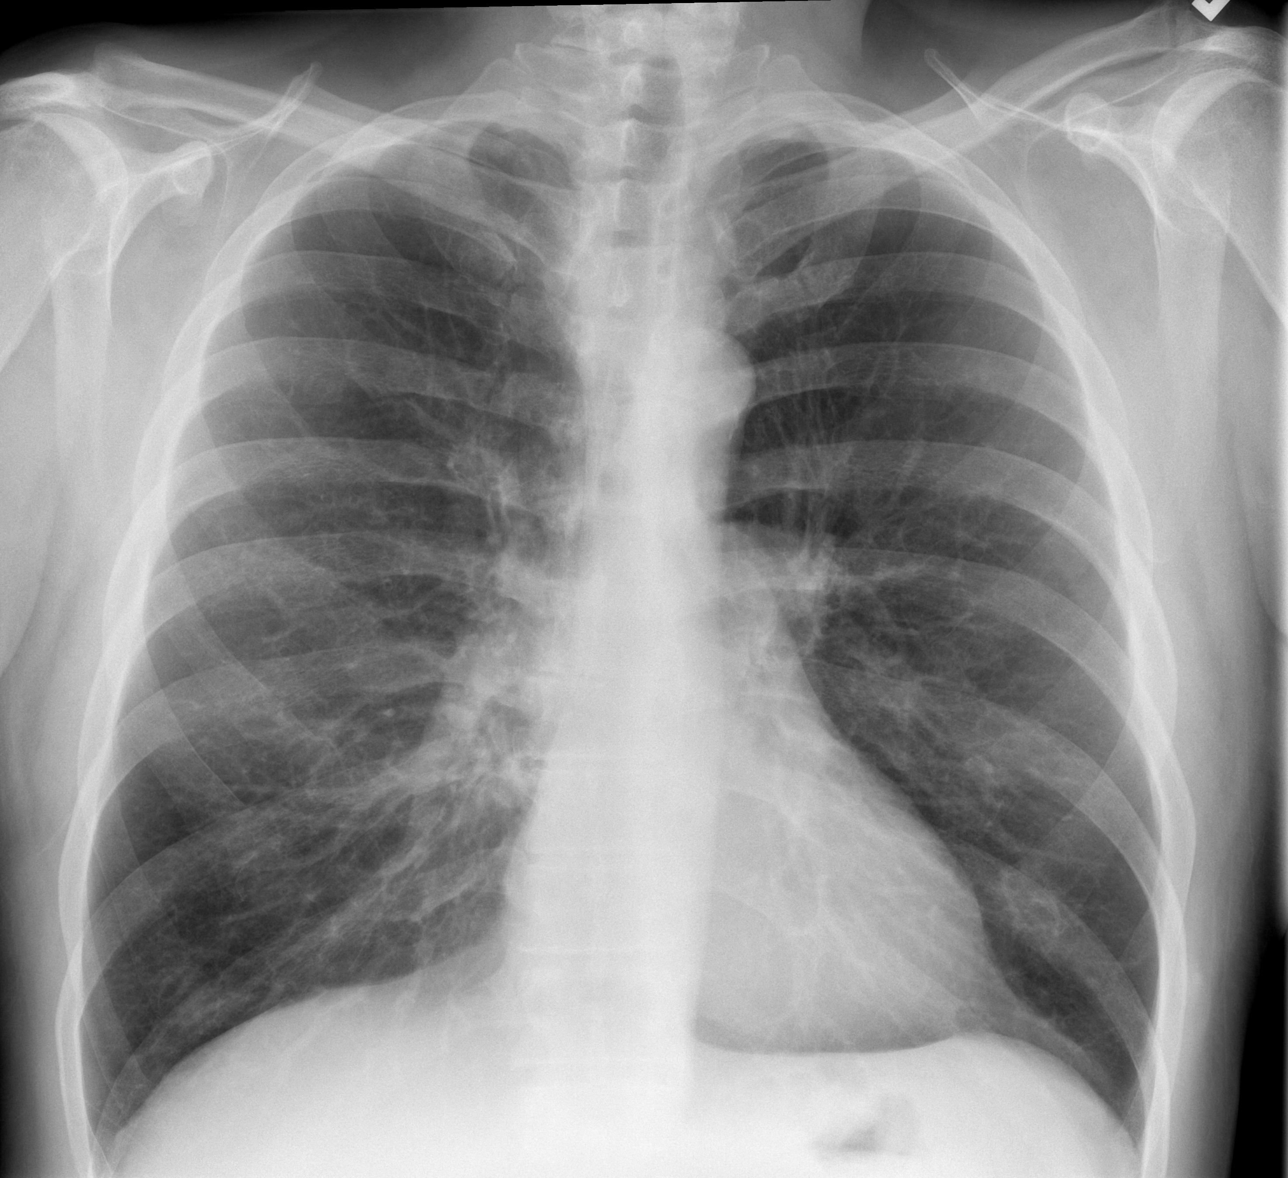

[w chest lat (1 of 2)]
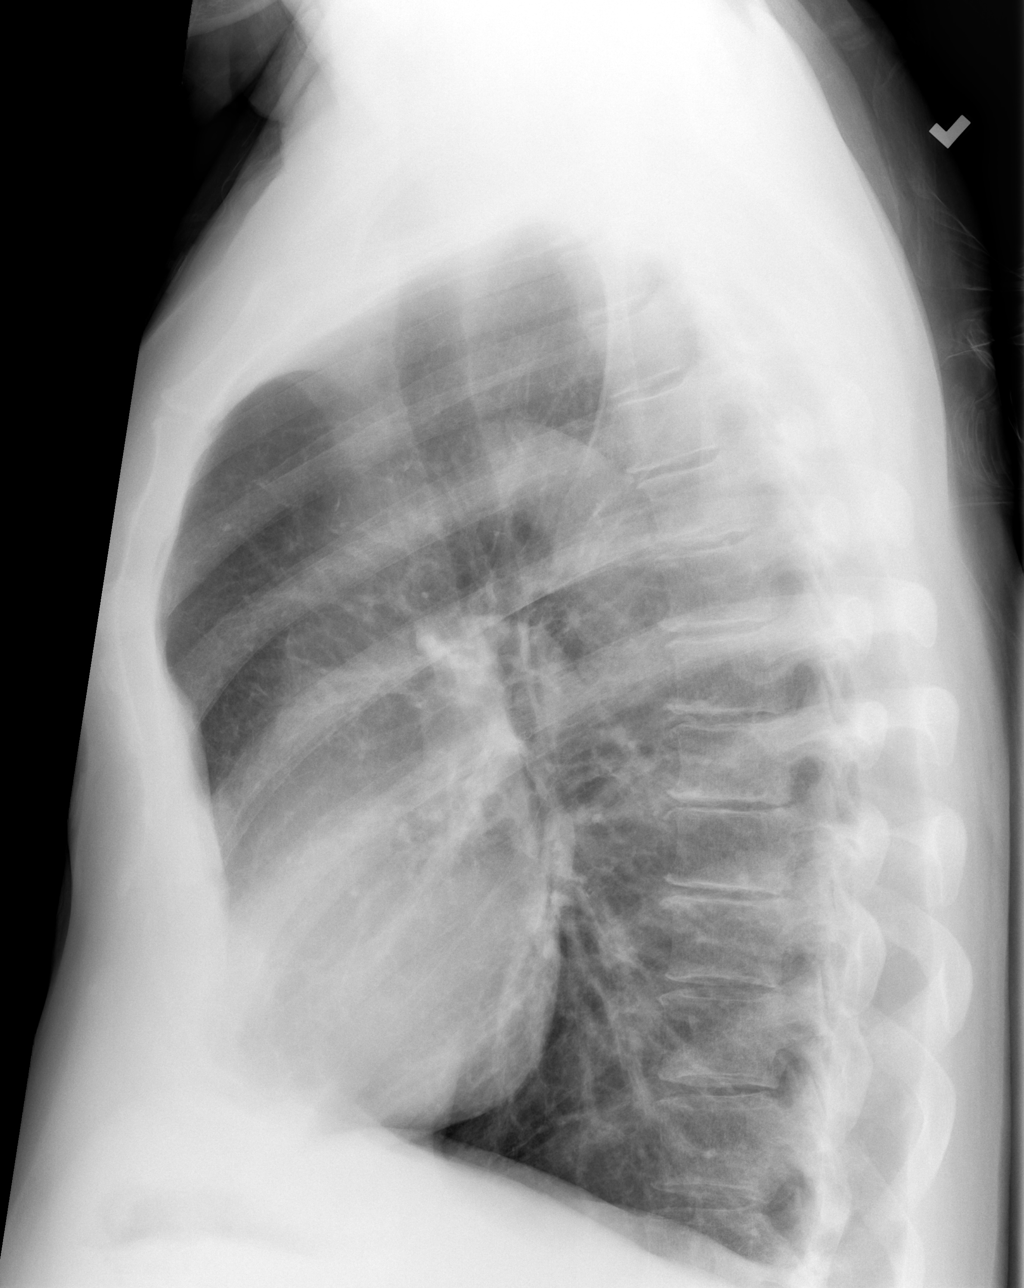

[w chest lat (2 of 2)]
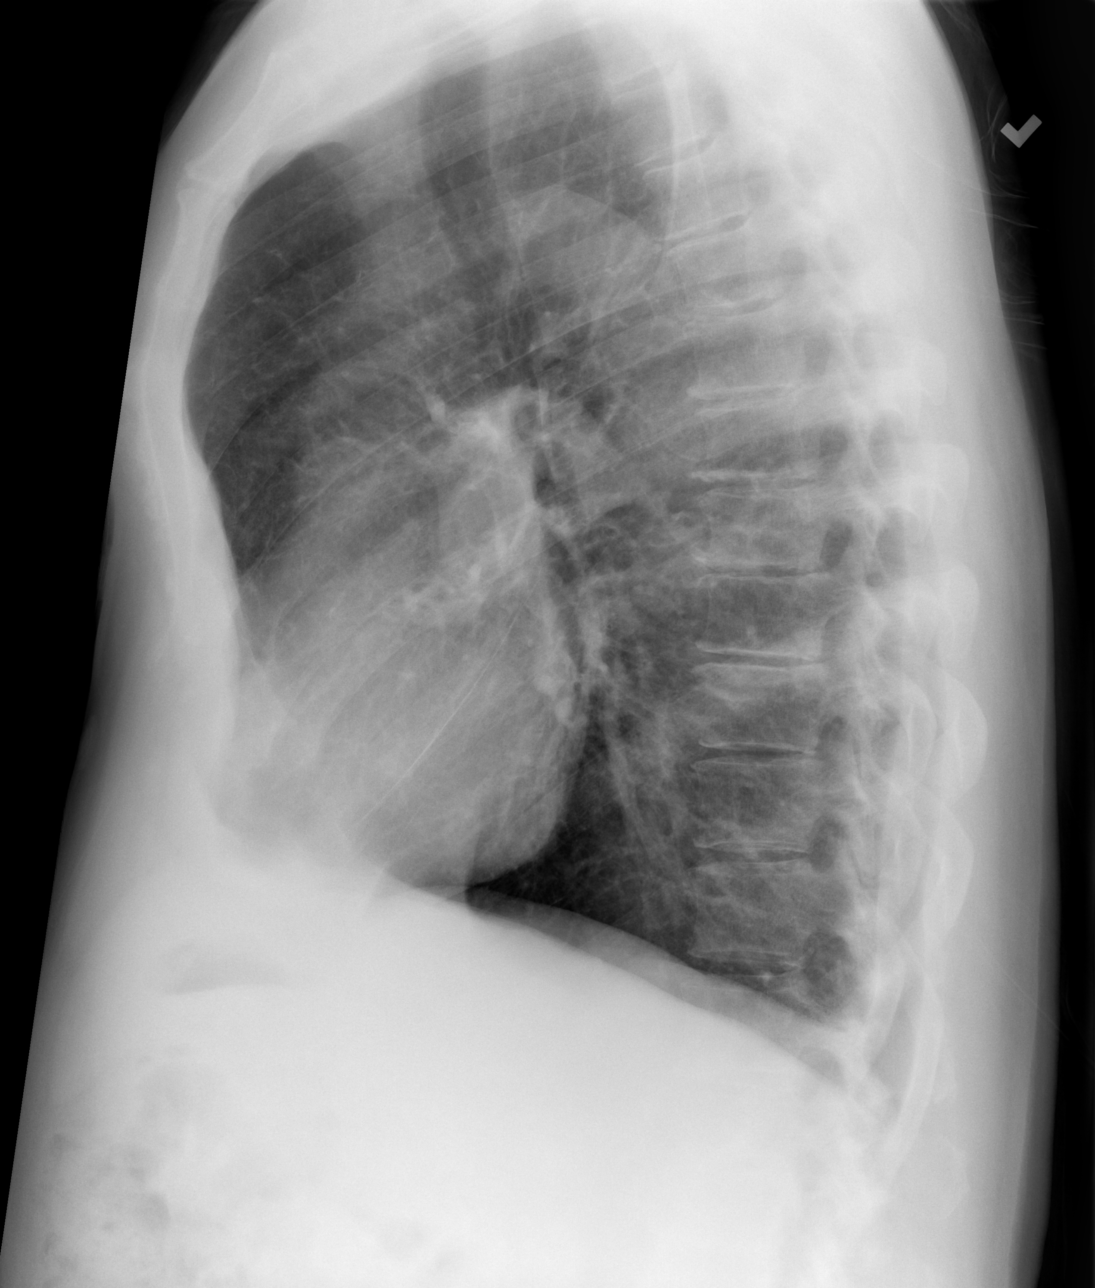

[3 of 3 positions shown; findings below may reference images not displayed]

FINDINGS: The heart size and mediastinal contours are within normal limits.
Both lungs are clear. No pleural effusion or pneumothorax. The
visualized skeletal structures are unremarkable.
IMPRESSION: No active cardiopulmonary disease.

## 2023-02-21 ENCOUNTER — Encounter: Payer: Self-pay | Admitting: *Deleted

## 2023-12-11 ENCOUNTER — Encounter (HOSPITAL_BASED_OUTPATIENT_CLINIC_OR_DEPARTMENT_OTHER): Payer: Self-pay | Admitting: Emergency Medicine

## 2023-12-11 ENCOUNTER — Emergency Department (HOSPITAL_BASED_OUTPATIENT_CLINIC_OR_DEPARTMENT_OTHER): Payer: Medicaid Other

## 2023-12-11 ENCOUNTER — Other Ambulatory Visit: Payer: Self-pay

## 2023-12-11 ENCOUNTER — Emergency Department (HOSPITAL_BASED_OUTPATIENT_CLINIC_OR_DEPARTMENT_OTHER)
Admission: EM | Admit: 2023-12-11 | Discharge: 2023-12-11 | Disposition: A | Discharge status: Home / Self Care | Payer: Medicaid Other | Attending: Emergency Medicine | Admitting: Emergency Medicine

## 2023-12-11 DIAGNOSIS — F17210 Nicotine dependence, cigarettes, uncomplicated: Secondary | ICD-10-CM | POA: Diagnosis not present

## 2023-12-11 DIAGNOSIS — J441 Chronic obstructive pulmonary disease with (acute) exacerbation: Secondary | ICD-10-CM | POA: Diagnosis not present

## 2023-12-11 DIAGNOSIS — Z7901 Long term (current) use of anticoagulants: Secondary | ICD-10-CM | POA: Insufficient documentation

## 2023-12-11 DIAGNOSIS — Z20822 Contact with and (suspected) exposure to covid-19: Secondary | ICD-10-CM | POA: Insufficient documentation

## 2023-12-11 DIAGNOSIS — R0602 Shortness of breath: Secondary | ICD-10-CM | POA: Diagnosis present

## 2023-12-11 LAB — COMPREHENSIVE METABOLIC PANEL
ALT: 18 U/L (ref 0–44)
AST: 19 U/L (ref 15–41)
Albumin: 3.9 g/dL (ref 3.5–5.0)
Alkaline Phosphatase: 71 U/L (ref 38–126)
Anion gap: 8 (ref 5–15)
BUN: 24 mg/dL — ABNORMAL HIGH (ref 8–23)
CO2: 26 mmol/L (ref 22–32)
Calcium: 9.1 mg/dL (ref 8.9–10.3)
Chloride: 104 mmol/L (ref 98–111)
Creatinine, Ser: 0.79 mg/dL (ref 0.61–1.24)
GFR, Estimated: >60 mL/min (ref >60)
Glucose, Bld: 175 mg/dL — ABNORMAL HIGH (ref 70–99)
Potassium: 3.9 mmol/L (ref 3.5–5.1)
Sodium: 138 mmol/L (ref 135–145)
Total Bilirubin: 0.5 mg/dL (ref 0.0–1.2)
Total Protein: 7.4 g/dL (ref 6.5–8.1)

## 2023-12-11 LAB — CBC WITH DIFFERENTIAL/PLATELET
Abs Immature Granulocytes: 0.03 10*3/uL (ref 0.00–0.07)
Basophils Absolute: 0 10*3/uL (ref 0.0–0.1)
Basophils Relative: 0 %
Eosinophils Absolute: 0.2 10*3/uL (ref 0.0–0.5)
Eosinophils Relative: 2 %
HCT: 46.1 % (ref 39.0–52.0)
Hemoglobin: 15.4 g/dL (ref 13.0–17.0)
Immature Granulocytes: 0 %
Lymphocytes Relative: 27 %
Lymphs Abs: 2.8 10*3/uL (ref 0.7–4.0)
MCH: 31.4 pg (ref 26.0–34.0)
MCHC: 33.4 g/dL (ref 30.0–36.0)
MCV: 93.9 fL (ref 80.0–100.0)
Monocytes Absolute: 0.8 10*3/uL (ref 0.1–1.0)
Monocytes Relative: 8 %
Neutro Abs: 6.6 10*3/uL (ref 1.7–7.7)
Neutrophils Relative %: 63 %
Platelets: 270 10*3/uL (ref 150–400)
RBC: 4.91 MIL/uL (ref 4.22–5.81)
RDW: 13.9 % (ref 11.5–15.5)
WBC: 10.5 10*3/uL (ref 4.0–10.5)
nRBC: 0 % (ref 0.0–0.2)

## 2023-12-11 LAB — RESP PANEL BY RT-PCR (RSV, FLU A&B, COVID)  RVPGX2
Influenza A by PCR: NEGATIVE
Influenza B by PCR: NEGATIVE
Resp Syncytial Virus by PCR: NEGATIVE
SARS Coronavirus 2 by RT PCR: NEGATIVE

## 2023-12-11 MED ORDER — IPRATROPIUM-ALBUTEROL 0.5-2.5 (3) MG/3ML IN SOLN
RESPIRATORY_TRACT | Status: AC
  Administered 2023-12-11: 3 mL
  Filled 2023-12-11: qty 3

## 2023-12-11 MED ORDER — ALBUTEROL SULFATE HFA 108 (90 BASE) MCG/ACT IN AERS: 2.0000 | INHALATION_SPRAY | RESPIRATORY_TRACT | Status: DC | PRN

## 2023-12-11 MED ORDER — DEXAMETHASONE SODIUM PHOSPHATE 10 MG/ML IJ SOLN
10.0000 mg | Freq: Once | INTRAMUSCULAR | Status: DC
  Filled 2023-12-11: qty 1

## 2023-12-11 MED ORDER — IPRATROPIUM-ALBUTEROL 0.5-2.5 (3) MG/3ML IN SOLN
3.0000 mL | Freq: Once | RESPIRATORY_TRACT | Status: AC
  Administered 2023-12-11: 3 mL via RESPIRATORY_TRACT
  Filled 2023-12-11: qty 3

## 2023-12-11 MED ORDER — AZITHROMYCIN 250 MG PO TABS: ORAL_TABLET | ORAL | 0 refills | Status: AC

## 2023-12-11 MED ORDER — ALBUTEROL SULFATE (2.5 MG/3ML) 0.083% IN NEBU
INHALATION_SOLUTION | RESPIRATORY_TRACT | Status: AC
  Administered 2023-12-11: 10 mg
  Filled 2023-12-11: qty 12

## 2023-12-11 MED ORDER — DEXAMETHASONE SODIUM PHOSPHATE 10 MG/ML IJ SOLN
10.0000 mg | Freq: Once | INTRAMUSCULAR | Status: AC
  Administered 2023-12-11: 10 mg via INTRAMUSCULAR

## 2023-12-11 MED ORDER — ALBUTEROL (5 MG/ML) CONTINUOUS INHALATION SOLN: 10.0000 mg/h | INHALATION_SOLUTION | Freq: Once | RESPIRATORY_TRACT | Status: DC

## 2023-12-11 MED ORDER — IPRATROPIUM-ALBUTEROL 0.5-2.5 (3) MG/3ML IN SOLN: 3.0000 mL | RESPIRATORY_TRACT | 0 refills | Status: AC | PRN

## 2023-12-11 NOTE — Discharge Instructions (Addendum)
 Please follow-up with your PCP regarding your shortness of breath.  Please utilize albuterol  inhaler, duo nebulizers at home as needed every 6 hours.  Please begin taking azithromycin .  You will take 500 mg day 1, followed by 250 mg days 2 through 5.  Please continue taking steroids provided to you at urgent [facility].  Please do not take steroids until tomorrow.  Return to the ED with any new or worsening symptoms.

## 2023-12-11 NOTE — ED Provider Notes (Signed)
 [place] [facility] Provider Note   CSN: [id number] Arrival date & time: 12/11/23  1332     History  Chief Complaint  Patient presents with   Shortness of Breath    Tanner Hawkins is a 63 y.o. male with medical history of COPD, atrial fibrillation, not anticoagulated.  Patient presents to ED for evaluation of shortness of breath.  States that since Saturday he has had shortness of breath.  Once at [facility] where he was complaining of a sore throat, diagnosed with sinus infection and placed on steroids.  Patient reports that the steroids have actually made his shortness of breath worse since onset.  Reports he still smokes cigarettes, last 1 to cigarette prior to arrival in the ED.  Denies lightheadedness, dizziness, weakness.  Denies chest pain, leg swelling.  Denies nausea, vomiting or abdominal pain.  Denies known sick contacts.  Denies any fevers, current sore throat.  States he has not taken any steroids today.   Shortness of Breath Associated symptoms: no chest pain, no fever and no sore throat        Home Medications Prior to Admission medications   Medication Sig Start Date End Date Taking? Authorizing Provider  azithromycin  (ZITHROMAX  Z-PAK) 250 MG tablet Take 2 tablets (500 mg total) by mouth daily for 1 day, THEN 1 tablet (250 mg total) daily for 4 days. 12/11/23 12/16/23 Yes Ruthell Lonni FALCON, PA-C  ipratropium-albuterol  (DUONEB) 0.5-2.5 (3) MG/3ML SOLN Take 3 mLs by nebulization every 4 (four) hours as needed. 12/11/23  Yes Ruthell Lonni FALCON, PA-C  albuterol  (PROVENTIL ) (5 MG/ML) 0.5% nebulizer solution Take 0.5 mLs (2.5 mg total) by nebulization every 6 (six) hours as needed for wheezing or shortness of breath. 10/12/21   Sponseller, Pleasant SAUNDERS, PA-C  celecoxib  (CELEBREX ) 200 MG capsule Take 1 capsule (200 mg total) by mouth 2 (two) times daily. 06/03/19   Haviland, Julie, MD  digoxin (LANOXIN) 0.25 MG tablet Take 0.25 mg by  mouth daily.    [provider]  diltiazem  (CARDIZEM ) 60 MG tablet Take 1 tablet (60 mg total) by mouth 4 (four) times daily. 06/05/16   Lynwood Anes, MD  furosemide  (LASIX ) 20 MG tablet Take 1 tablet (20 mg total) by mouth daily. 06/05/16   Lynwood Anes, MD  metFORMIN (GLUCOPHAGE) 500 MG tablet Take by mouth 2 (two) times daily with a meal.    [provider]  naproxen  (NAPROSYN ) 500 MG tablet Take 1 tablet (500 mg total) by mouth 2 (two) times daily. Limit use to 3-5 days and do not take in combination with celebrex  06/15/19   Horton, Charmaine FALCON, MD  ondansetron  (ZOFRAN  ODT) 4 MG disintegrating tablet Take 1 tablet (4 mg total) by mouth every 8 (eight) hours as needed for nausea or vomiting. 02/21/21   Joy, Shawn C, PA-C  potassium chloride (K-DUR,KLOR-CON) 10 MEQ tablet Take 10 mEq by mouth 2 (two) times daily.    [provider]  predniSONE  (STERAPRED UNI-PAK 21 TAB) 10 MG (21) TBPK tablet Take by mouth daily. Take 6 tabs by mouth daily  for 2 days, then 5 tabs for 2 days, then 4 tabs for 2 days, then 3 tabs for 2 days, 2 tabs for 2 days, then 1 tab by mouth daily for 2 days 10/12/21   Sponseller, Pleasant R, PA-C  rivaroxaban (XARELTO) 10 MG TABS tablet Take 10 mg by mouth daily.    [provider]      Allergies  Patient has no known allergies.    Review of Systems   Review of Systems  Constitutional:  Negative for fever.  HENT:  Negative for sore throat.   Respiratory:  Positive for shortness of breath.   Cardiovascular:  Negative for chest pain.  Neurological:  Negative for dizziness, weakness and light-headedness.    Physical Exam Updated Vital Signs BP (!) 144/95   Pulse (!) 116   Temp 97.6 F (36.4 C) (Oral)   Resp 18   Ht 5' 11 (1.803 m)   Wt 88.5 kg   SpO2 96%   BMI 27.20 kg/m  Physical Exam Vitals and nursing note reviewed.  Constitutional:      General: He is not in acute distress.    Appearance: He is not ill-appearing,  toxic-appearing or diaphoretic.  Cardiovascular:     Rate and Rhythm: Normal rate. Rhythm irregular.  Pulmonary:     Effort: Pulmonary effort is normal.     Breath sounds: Wheezing present.  Abdominal:     General: Abdomen is flat. Bowel sounds are normal.     Tenderness: There is no abdominal tenderness.  Skin:    General: Skin is warm and dry.     Capillary Refill: Capillary refill takes less than 2 seconds.  Neurological:     Mental Status: He is alert and oriented to person, place, and time.     ED Results / Procedures / Treatments   Labs (all labs ordered are listed, but only abnormal results are displayed) Labs Reviewed  COMPREHENSIVE METABOLIC PANEL - Abnormal; Notable for the following components:      Result Value   Glucose, Bld 175 (*)    BUN 24 (*)    All other components within normal limits  RESP PANEL BY RT-PCR (RSV, FLU A&B, COVID)  RVPGX2  CBC WITH DIFFERENTIAL/PLATELET    EKG EKG Interpretation Date/Time:  Tuesday December 11 2023 13:58:34 EST Ventricular Rate:  98 PR Interval:    QRS Duration:  114 QT Interval:  375 QTC Calculation: 464 R Axis:   152  Text Interpretation: Atrial fibrillation IRBBB and LPFB Low voltage, precordial leads Consider anterior infarct Baseline wander in lead(s) V1 Confirmed by Jerral Meth [id number]) on 12/11/2023 8:45:12 PM  Radiology DG Chest 2 View Result Date: 12/11/2023 CLINICAL DATA:  Shortness of breath. EXAM: CHEST - 2 VIEW COMPARISON:  10/12/2021. FINDINGS: Bilateral lung fields are clear. Bilateral costophrenic angles are clear. Normal cardio-mediastinal silhouette. No acute osseous abnormalities. The soft tissues are within normal limits. IMPRESSION: No active cardiopulmonary disease. Electronically Signed   By: Ree Molt M.D.   On: 12/11/2023 16:02    Procedures Procedures    Medications Ordered in ED Medications  albuterol  (VENTOLIN  HFA) 108 (90 Base) MCG/ACT inhaler 2 puff (has no administration in  time range)  albuterol  (PROVENTIL ,VENTOLIN ) solution continuous neb (10 mg/hr Nebulization Not Given 12/11/23 1933)  ipratropium-albuterol  (DUONEB) 0.5-2.5 (3) MG/3ML nebulizer solution 3 mL (3 mLs Nebulization Given 12/11/23 1804)  ipratropium-albuterol  (DUONEB) 0.5-2.5 (3) MG/3ML nebulizer solution (3 mLs  Given 12/11/23 1407)  dexamethasone  (DECADRON ) injection 10 mg (10 mg Intramuscular Given 12/11/23 1755)  albuterol  (PROVENTIL ) (2.5 MG/3ML) 0.083% nebulizer solution (10 mg  Given 12/11/23 1935)    ED Course/ Medical Decision Making/ A&P  Medical Decision Making Amount and/or Complexity of Data Reviewed Labs: ordered. Radiology: ordered.  Risk Prescription drug management.   63 year old male presents for evaluation.  Please see HPI for further details.  On initial exam patient afebrile and  nontachycardic.  His lung sounds have wheezing throughout, he is not hypoxic on room air.  His abdomen is soft and compressible throughout.  Neurological examinations at baseline.  No abnormal lung sounds noted.  Patient overall nontoxic in appearance.  Patient given initial DuoNeb and triage.  Reports that his shortness of breath has slightly alleviated however still wheezing.  Will provide additional DuoNeb, Decadron .  Will collect labs and chest x-ray.  CBC unremarkable.  CMP unremarkable.  Viral panel negative for all.  Chest x-ray shows no consolidations or effusions.  Patient reassessed, continues to have wheezing.  Will provide patient with 10 mg continuous albuterol  inhaler.  Patient reassessed after continuous duo nebulizer.  Patient lung sounds have normalized at this time.  He was ambulated and maintained oxygen saturation however did appear dyspneic.  The patient was offered admission however states he would wish to go home.  Patient will be sent home with duo nebulizers, reports he has nebulizer at home.  Will send him home with a Z-Pak as well as advising him to continue taking steroids  provided to him on Saturday.  Return precautions provided and he voiced understanding.  Stable to discharge home.  Final Clinical Impression(s) / ED Diagnoses Final diagnoses:  COPD exacerbation (HCC)    Rx / DC Orders ED Discharge Orders          Ordered    azithromycin  (ZITHROMAX  Z-PAK) 250 MG tablet  Daily        12/11/23 2053    ipratropium-albuterol  (DUONEB) 0.5-2.5 (3) MG/3ML SOLN  Every 4 hours PRN        12/11/23 2053              Ruthell Lonni FALCON, PA-C 12/11/23 2054    Jerral Meth, MD 12/12/23 1459

## 2023-12-11 NOTE — ED Triage Notes (Addendum)
Sob  since yesterday  no nausea , has afib all the time no cp was seen at [facility] on sat and was told he had a sinus infection , placed on steriods

## 2024-02-14 ENCOUNTER — Telehealth: Payer: Self-pay

## 2024-02-14 NOTE — Telephone Encounter (Signed)
 Patient called stating he received his Handicap placard but the whole thing is blank. Can we fill out another one and mail it to him please

## 2024-02-14 NOTE — Telephone Encounter (Addendum)
 Form is on my desk waiting for 02/19/24 OV. Pt informed.

## 2024-02-19 ENCOUNTER — Ambulatory Visit (INDEPENDENT_AMBULATORY_CARE_PROVIDER_SITE_OTHER): Admitting: Sports Medicine

## 2024-02-19 ENCOUNTER — Encounter: Payer: Self-pay | Admitting: Sports Medicine

## 2024-02-19 VITALS — BP 124/76 | Ht 71.0 in | Wt 195.0 lb

## 2024-02-19 DIAGNOSIS — M21371 Foot drop, right foot: Secondary | ICD-10-CM | POA: Diagnosis not present

## 2024-02-19 MED ORDER — IBUPROFEN 800 MG PO TABS: 800.0000 mg | ORAL_TABLET | Freq: Two times a day (BID) | ORAL | 0 refills | Status: DC | PRN

## 2024-02-19 NOTE — Progress Notes (Signed)
   Subjective:    Patient ID: Tanner Hawkins, male    DOB: 23-Apr-1961, 63 y.o.   MRN: [id number]  HPI   Patient presents today to have his handicap placard renewed.  He has a history of a chronic foot drop that has been present since a serious leg injury many years ago.  He wears an AFO but would be interested in getting another one.  He also takes 800 mg of ibuprofen as needed for pain but he needs a refill.    Review of Systems As above    Objective:   Physical Exam  Right ankle: There is no active dorsiflexion of the right ankle.  Good plantarflexion.      Assessment & Plan:   Chronic right foot drop  Updated handicap placard.  Prescription for new AFO at [facility] in [facility].  We will refill his ibuprofen to take as needed.  Follow-up as needed.  This note was dictated using Dragon naturally speaking software and may contain errors in syntax, spelling, or content which have not been identified prior to signing this note.

## 2024-03-12 ENCOUNTER — Telehealth: Payer: Self-pay | Admitting: *Deleted

## 2024-03-12 MED ORDER — IBUPROFEN 800 MG PO TABS: 800.0000 mg | ORAL_TABLET | Freq: Two times a day (BID) | ORAL | 0 refills | Status: DC | PRN

## 2024-03-12 NOTE — Telephone Encounter (Signed)
-----   Message from Clearnce Curia sent at 03/12/2024  2:30 PM EDT ----- Regarding: FW: Patient request Rx refill for 800 MG Ibuprofin OK to refill ----- Message ----- From: Rondel Coddington Sent: 03/12/2024   1:20 PM EDT To: Frazier Jacob, DO Subject: Patient request Rx refill for 800 MG Ibuprof#  Pt cld ask for Rx refill :  ibuprofen  (ADVIL ) 800 MG tablet [[id number]]   Order Details Dose: 800 mg Route: Oral Frequency: 2 times daily PRN Dispense Quantity: 60 tablet Refills: 0      Sig: Take 1 tablet (800 mg total) by mouth 2 (two) times daily as needed.  Pt uses :  [id number] - [place], [place] - [address] [place] [address] Phone: [phone]  Fax: [phone]  Thx

## 2024-03-12 NOTE — Telephone Encounter (Signed)
Refill sent. See meds.  

## 2024-05-22 ENCOUNTER — Other Ambulatory Visit: Payer: Self-pay | Admitting: Sports Medicine

## 2024-09-30 ENCOUNTER — Ambulatory Visit (INDEPENDENT_AMBULATORY_CARE_PROVIDER_SITE_OTHER)

## 2024-09-30 VITALS — BP 120/62 | Ht 71.0 in | Wt 195.0 lb

## 2024-09-30 DIAGNOSIS — M1711 Unilateral primary osteoarthritis, right knee: Secondary | ICD-10-CM | POA: Diagnosis not present

## 2024-09-30 DIAGNOSIS — G5761 Lesion of plantar nerve, right lower limb: Secondary | ICD-10-CM

## 2024-09-30 DIAGNOSIS — M21371 Foot drop, right foot: Secondary | ICD-10-CM

## 2024-09-30 MED ORDER — IBUPROFEN 800 MG PO TABS
800.0000 mg | ORAL_TABLET | Freq: Two times a day (BID) | ORAL | 0 refills | Status: AC | PRN
Start: 2024-09-30 — End: ?

## 2024-09-30 NOTE — Progress Notes (Signed)
 Subjective:    Patient ID: Tanner Hawkins, male    DOB: 63 y.o., 1961-11-05   MRN: [id number]  Chief Complaint: Right knee and foot pain (requesting handicap placard renewal)  Discussed the use of AI scribe software for clinical note transcription with the patient, who gave verbal consent to proceed.  History of Present Illness Patient is a 63 year old male with past medical history significant for right knee osteoarthritis, right elbow olecranon bursitis, and right foot drop presenting for handicap placard renewal in the context of right knee OA and right foot pain. Foot drop resulted from serious like injury many years ago.  Wears AFO for this to good effect.  Right knee pain - Significant pain in the right knee, described as the most bothersome symptom - Prolonged relief previously achieved with an injection - No participation in physical therapy for the knee - States he has not had pain in his right knee since an injection which he received a very long time ago  Right foot pain and sensory disturbance - Sharp and sometimes burning pain localized to the top of the right foot - Occasional tingling sensation - Pain sometimes awakens him at night - Describes sensation similar to a 'pea in his shoe' without any object present - No radiating pain into the toes - Certain areas of the foot do not elicit discomfort when pressed - Patient reporting my foot does not bother me when I walk.  I walk all the time!  Foot drop and orthotic use - History of foot drop secondary to prior accident involving a broken leg and dislocated foot - Uses an ankle-foot orthosis (AFO) brace, which is effective - AFO brace requires replacement; Medicaid covers replacement every five years - Previous brace broke a couple of years ago  Review of pertinent imaging: 4 view plain film radiographs obtained of the right knee on 08/27/2021 per my dependent review revealing moderate joint space narrowing greater in  the medial compartment in the left with modest amount of osteophytic change along the lateral edge of the tibial plateau.  No imaging of the right foot available in our system     Objective:   There were no vitals filed for this visit.   Right knee Visible effusion No tenderness to palpation along the medial and lateral joint lines. Mild tenderness in the quadriceps tendon. No tenderness in the patella tendon. Mild crepitus noted with flexion/extension Nonantalgic gait.  Right foot AFO in place. Once the AFO was removed, patient has a noted keratin horn present between the 4th and 3rd digits on the dorsal aspect. Positive Mulder sign.  Positive squeeze test of the forefoot.  Positive click with squeeze test. Tenderness to palpation in the third interspace     Assessment & Plan:   Assessment & Plan Morton's neuroma, right foot   Morton's neuroma in the right foot causes sharp, burning pain during ambulation and occasionally at night. Pain is localized between the toes without radiation. Likely due to nerve inflammation and enlargement in the interdigital space. Scheduled ultrasound-guided steroid injection. Provided educational material.  Provided short refill for ibuprofen  to aid with pain management.  Do not see medical indication for handicap placard at this time.  Foot drop, right foot   Chronic foot drop in the right foot is secondary to a previous leg injury and surgery. Current AFO is inadequate, causing ambulation difficulty. Ordered replacement AFO from the [facility]. Advised to visit the clinic for replacement.  Unilateral primary osteoarthritis, right  knee   Chronic osteoarthritis in the right knee with previous fluid accumulation. Significant relief from past injection, no current pain.  Lab Results  Component Value Date   CREATININE 0.79 12/11/2023

## 2024-10-23 ENCOUNTER — Ambulatory Visit
